# Patient Record
Sex: Female | Born: 1960 | Race: Black or African American | Hispanic: No | Marital: Married | Smoking: Current every day smoker
Health system: Southern US, Community
[De-identification: ages and names within clinical notes are randomized; demographics above are authoritative.]

## PROBLEM LIST (undated history)

## (undated) DIAGNOSIS — Z860100 Personal history of colon polyps, unspecified: Secondary | ICD-10-CM

## (undated) DIAGNOSIS — E785 Hyperlipidemia, unspecified: Secondary | ICD-10-CM

## (undated) DIAGNOSIS — E559 Vitamin D deficiency, unspecified: Secondary | ICD-10-CM

## (undated) DIAGNOSIS — R0789 Other chest pain: Secondary | ICD-10-CM

## (undated) DIAGNOSIS — R5382 Chronic fatigue, unspecified: Secondary | ICD-10-CM

## (undated) DIAGNOSIS — R42 Dizziness and giddiness: Secondary | ICD-10-CM

## (undated) DIAGNOSIS — I10 Essential (primary) hypertension: Secondary | ICD-10-CM

## (undated) DIAGNOSIS — N3281 Overactive bladder: Secondary | ICD-10-CM

## (undated) DIAGNOSIS — R739 Hyperglycemia, unspecified: Secondary | ICD-10-CM

## (undated) DIAGNOSIS — M1990 Unspecified osteoarthritis, unspecified site: Secondary | ICD-10-CM

## (undated) HISTORY — DX: Chronic fatigue, unspecified: R53.82

## (undated) HISTORY — DX: Essential (primary) hypertension: I10

## (undated) HISTORY — DX: Personal history of colon polyps, unspecified: Z86.0100

## (undated) HISTORY — PX: CHOLECYSTECTOMY: SHX55

## (undated) HISTORY — DX: Hyperglycemia, unspecified: R73.9

## (undated) HISTORY — DX: Hyperlipidemia, unspecified: E78.5

## (undated) HISTORY — DX: Other chest pain: R07.89

## (undated) HISTORY — DX: Personal history of colonic polyps: Z86.010

## (undated) HISTORY — DX: Vitamin D deficiency, unspecified: E55.9

## (undated) HISTORY — DX: Unspecified osteoarthritis, unspecified site: M19.90

## (undated) HISTORY — PX: COLONOSCOPY: SHX174

## (undated) HISTORY — PX: ABDOMINAL HYSTERECTOMY: SHX81

## (undated) HISTORY — DX: Overactive bladder: N32.81

---

## 2002-02-05 ENCOUNTER — Emergency Department (HOSPITAL_COMMUNITY): Admission: EM | Admit: 2002-02-05 | Discharge: 2002-02-06 | Payer: Self-pay | Admitting: Emergency Medicine

## 2002-02-06 ENCOUNTER — Encounter: Payer: Self-pay | Admitting: Emergency Medicine

## 2007-04-12 ENCOUNTER — Ambulatory Visit: Payer: Self-pay | Admitting: Cardiology

## 2009-06-20 LAB — HM HEPATITIS C SCREENING LAB: HM Hepatitis Screen: NEGATIVE

## 2009-07-02 ENCOUNTER — Emergency Department (HOSPITAL_COMMUNITY): Admission: EM | Admit: 2009-07-02 | Discharge: 2009-07-02 | Payer: Self-pay | Admitting: Emergency Medicine

## 2010-07-21 LAB — DIFFERENTIAL
Basophils Absolute: 0.1 10*3/uL (ref 0.0–0.1)
Basophils Relative: 1 % (ref 0–1)
Eosinophils Absolute: 0.1 10*3/uL (ref 0.0–0.7)
Eosinophils Relative: 1 % (ref 0–5)
Lymphocytes Relative: 29 % (ref 12–46)
Lymphs Abs: 2.2 10*3/uL (ref 0.7–4.0)
Monocytes Absolute: 0.4 10*3/uL (ref 0.1–1.0)
Monocytes Relative: 5 % (ref 3–12)
Neutro Abs: 4.8 10*3/uL (ref 1.7–7.7)
Neutrophils Relative %: 64 % (ref 43–77)

## 2010-07-21 LAB — BASIC METABOLIC PANEL
BUN: 13 mg/dL (ref 6–23)
CO2: 24 mEq/L (ref 19–32)
Calcium: 8.9 mg/dL (ref 8.4–10.5)
Chloride: 104 mEq/L (ref 96–112)
Creatinine, Ser: 0.8 mg/dL (ref 0.4–1.2)
GFR calc Af Amer: >60 mL/min (ref >60)
GFR calc non Af Amer: >60 mL/min (ref >60)
Glucose, Bld: 87 mg/dL (ref 70–99)
Potassium: 3.7 mEq/L (ref 3.5–5.1)
Sodium: 137 mEq/L (ref 135–145)

## 2010-07-21 LAB — POCT CARDIAC MARKERS
CKMB, poc: 2.4 ng/mL (ref 1.0–8.0)
Myoglobin, poc: 142 ng/mL (ref 12–200)
Myoglobin, poc: 153 ng/mL (ref 12–200)
Troponin i, poc: <0.05 ng/mL (ref 0.00–0.09)

## 2010-07-21 LAB — CBC
HCT: 41.5 % (ref 36.0–46.0)
Hemoglobin: 13.7 g/dL (ref 12.0–15.0)
MCHC: 33 g/dL (ref 30.0–36.0)
MCV: 86.6 fL (ref 78.0–100.0)
Platelets: 269 10*3/uL (ref 150–400)
RBC: 4.79 MIL/uL (ref 3.87–5.11)
RDW: 16.1 % — ABNORMAL HIGH (ref 11.5–15.5)
WBC: 7.6 10*3/uL (ref 4.0–10.5)

## 2010-07-23 ENCOUNTER — Emergency Department (INDEPENDENT_AMBULATORY_CARE_PROVIDER_SITE_OTHER): Payer: Worker's Compensation

## 2010-07-23 ENCOUNTER — Emergency Department (HOSPITAL_BASED_OUTPATIENT_CLINIC_OR_DEPARTMENT_OTHER)
Admission: EM | Admit: 2010-07-23 | Discharge: 2010-07-23 | Disposition: A | Discharge status: Home / Self Care | Payer: Worker's Compensation | Attending: Emergency Medicine | Admitting: Emergency Medicine

## 2010-07-23 DIAGNOSIS — W2203XA Walked into furniture, initial encounter: Secondary | ICD-10-CM

## 2010-07-23 DIAGNOSIS — S60229A Contusion of unspecified hand, initial encounter: Secondary | ICD-10-CM | POA: Insufficient documentation

## 2010-07-23 DIAGNOSIS — I10 Essential (primary) hypertension: Secondary | ICD-10-CM | POA: Insufficient documentation

## 2010-07-23 DIAGNOSIS — W2209XA Striking against other stationary object, initial encounter: Secondary | ICD-10-CM | POA: Insufficient documentation

## 2010-09-14 ENCOUNTER — Emergency Department (HOSPITAL_COMMUNITY)
Admission: EM | Admit: 2010-09-14 | Discharge: 2010-09-14 | Disposition: A | Discharge status: Home / Self Care | Payer: BC Managed Care – PPO | Attending: Emergency Medicine | Admitting: Emergency Medicine

## 2010-09-14 ENCOUNTER — Emergency Department (HOSPITAL_COMMUNITY): Payer: BC Managed Care – PPO

## 2010-09-14 DIAGNOSIS — S92109A Unspecified fracture of unspecified talus, initial encounter for closed fracture: Secondary | ICD-10-CM | POA: Insufficient documentation

## 2010-09-14 DIAGNOSIS — I10 Essential (primary) hypertension: Secondary | ICD-10-CM | POA: Insufficient documentation

## 2010-09-14 DIAGNOSIS — S92309A Fracture of unspecified metatarsal bone(s), unspecified foot, initial encounter for closed fracture: Secondary | ICD-10-CM | POA: Insufficient documentation

## 2010-09-14 DIAGNOSIS — M79609 Pain in unspecified limb: Secondary | ICD-10-CM | POA: Insufficient documentation

## 2010-09-14 DIAGNOSIS — M7989 Other specified soft tissue disorders: Secondary | ICD-10-CM | POA: Insufficient documentation

## 2010-09-14 DIAGNOSIS — X500XXA Overexertion from strenuous movement or load, initial encounter: Secondary | ICD-10-CM | POA: Insufficient documentation

## 2013-04-27 HISTORY — PX: COLONOSCOPY: SHX174

## 2015-01-29 ENCOUNTER — Encounter (HOSPITAL_BASED_OUTPATIENT_CLINIC_OR_DEPARTMENT_OTHER): Payer: Self-pay | Admitting: Emergency Medicine

## 2015-01-29 ENCOUNTER — Emergency Department (HOSPITAL_BASED_OUTPATIENT_CLINIC_OR_DEPARTMENT_OTHER): Payer: 59

## 2015-01-29 ENCOUNTER — Emergency Department (HOSPITAL_BASED_OUTPATIENT_CLINIC_OR_DEPARTMENT_OTHER)
Admission: EM | Admit: 2015-01-29 | Discharge: 2015-01-30 | Disposition: A | Discharge status: Home / Self Care | Payer: 59 | Attending: Emergency Medicine | Admitting: Emergency Medicine

## 2015-01-29 DIAGNOSIS — R110 Nausea: Secondary | ICD-10-CM | POA: Insufficient documentation

## 2015-01-29 DIAGNOSIS — R42 Dizziness and giddiness: Secondary | ICD-10-CM | POA: Insufficient documentation

## 2015-01-29 DIAGNOSIS — R079 Chest pain, unspecified: Secondary | ICD-10-CM

## 2015-01-29 DIAGNOSIS — Z720 Tobacco use: Secondary | ICD-10-CM | POA: Insufficient documentation

## 2015-01-29 DIAGNOSIS — Z79899 Other long term (current) drug therapy: Secondary | ICD-10-CM | POA: Insufficient documentation

## 2015-01-29 DIAGNOSIS — I10 Essential (primary) hypertension: Secondary | ICD-10-CM | POA: Insufficient documentation

## 2015-01-29 HISTORY — DX: Dizziness and giddiness: R42

## 2015-01-29 LAB — BASIC METABOLIC PANEL
ANION GAP: 7 (ref 5–15)
BUN: 16 mg/dL (ref 6–20)
CO2: 28 mmol/L (ref 22–32)
Calcium: 9.2 mg/dL (ref 8.9–10.3)
Chloride: 104 mmol/L (ref 101–111)
Creatinine, Ser: 0.98 mg/dL (ref 0.44–1.00)
Glucose, Bld: 101 mg/dL — ABNORMAL HIGH (ref 65–99)
POTASSIUM: 3.6 mmol/L (ref 3.5–5.1)
SODIUM: 139 mmol/L (ref 135–145)

## 2015-01-29 LAB — CBC
HEMATOCRIT: 40 % (ref 36.0–46.0)
Hemoglobin: 13.1 g/dL (ref 12.0–15.0)
MCH: 27 pg (ref 26.0–34.0)
MCHC: 32.8 g/dL (ref 30.0–36.0)
MCV: 82.5 fL (ref 78.0–100.0)
Platelets: 308 10*3/uL (ref 150–400)
RBC: 4.85 MIL/uL (ref 3.87–5.11)
RDW: 15 % (ref 11.5–15.5)
WBC: 9.3 10*3/uL (ref 4.0–10.5)

## 2015-01-29 LAB — TROPONIN I: Troponin I: <0.03 ng/mL (ref <0.031)

## 2015-01-29 MED ORDER — ONDANSETRON HCL 4 MG/2ML IJ SOLN
4.0000 mg | Freq: Once | INTRAMUSCULAR | Status: AC
  Administered 2015-01-29: 4 mg via INTRAVENOUS
  Filled 2015-01-29: qty 2

## 2015-01-29 MED ORDER — NITROGLYCERIN 0.4 MG SL SUBL
0.4000 mg | SUBLINGUAL_TABLET | SUBLINGUAL | Status: DC | PRN
Start: 2015-01-29 — End: 2015-01-30
  Administered 2015-01-29 (×3): 0.4 mg via SUBLINGUAL
  Filled 2015-01-29: qty 1

## 2015-01-29 MED ORDER — FENTANYL CITRATE (PF) 100 MCG/2ML IJ SOLN
50.0000 ug | Freq: Once | INTRAMUSCULAR | Status: AC
Start: 2015-01-29 — End: 2015-01-29
  Administered 2015-01-29: 50 ug via INTRAVENOUS
  Filled 2015-01-29: qty 2

## 2015-01-29 MED ORDER — ACETAMINOPHEN 325 MG PO TABS
650.0000 mg | ORAL_TABLET | Freq: Once | ORAL | Status: AC
  Administered 2015-01-29: 650 mg via ORAL
  Filled 2015-01-29: qty 2

## 2015-01-29 NOTE — ED Notes (Signed)
Pt expresses relief and decreased chest pain, now c/o headache.

## 2015-01-29 NOTE — Discharge Instructions (Signed)
Nonspecific Chest Pain  °Chest pain can be caused by many different conditions. There is always a chance that your pain could be related to something serious, such as a heart attack or a blood clot in your lungs. Chest pain can also be caused by conditions that are not life-threatening. If you have chest pain, it is very important to follow up with your health care provider. °CAUSES  °Chest pain can be caused by: °· Heartburn. °· Pneumonia or bronchitis. °· Anxiety or stress. °· Inflammation around your heart (pericarditis) or lung (pleuritis or pleurisy). °· A blood clot in your lung. °· A collapsed lung (pneumothorax). It can develop suddenly on its own (spontaneous pneumothorax) or from trauma to the chest. °· Shingles infection (varicella-zoster virus). °· Heart attack. °· Damage to the bones, muscles, and cartilage that make up your chest wall. This can include: °¨ Bruised bones due to injury. °¨ Strained muscles or cartilage due to frequent or repeated coughing or overwork. °¨ Fracture to one or more ribs. °¨ Sore cartilage due to inflammation (costochondritis). °RISK FACTORS  °Risk factors for chest pain may include: °· Activities that increase your risk for trauma or injury to your chest. °· Respiratory infections or conditions that cause frequent coughing. °· Medical conditions or overeating that can cause heartburn. °· Heart disease or family history of heart disease. °· Conditions or health behaviors that increase your risk of developing a blood clot. °· Having had chicken pox (varicella zoster). °SIGNS AND SYMPTOMS °Chest pain can feel like: °· Burning or tingling on the surface of your chest or deep in your chest. °· Crushing, pressure, aching, or squeezing pain. °· Dull or sharp pain that is worse when you move, cough, or take a deep breath. °· Pain that is also felt in your back, neck, shoulder, or arm, or pain that spreads to any of these areas. °Your chest pain may come and go, or it may stay  constant. °DIAGNOSIS °Lab tests or other studies may be needed to find the cause of your pain. Your health care provider may have you take a test called an ambulatory ECG (electrocardiogram). An ECG records your heartbeat patterns at the time the test is performed. You may also have other tests, such as: °· Transthoracic echocardiogram (TTE). During echocardiography, sound waves are used to create a picture of all of the heart structures and to look at how blood flows through your heart. °· Transesophageal echocardiogram (TEE). This is a more advanced imaging test that obtains images from inside your body. It allows your health care provider to see your heart in finer detail. °· Cardiac monitoring. This allows your health care provider to monitor your heart rate and rhythm in real time. °· Holter monitor. This is a portable device that records your heartbeat and can help to diagnose abnormal heartbeats. It allows your health care provider to track your heart activity for several days, if needed. °· Stress tests. These can be done through exercise or by taking medicine that makes your heart beat more quickly. °· Blood tests. °· Imaging tests. °TREATMENT  °Your treatment depends on what is causing your chest pain. Treatment may include: °· Medicines. These may include: °¨ Acid blockers for heartburn. °¨ Anti-inflammatory medicine. °¨ Pain medicine for inflammatory conditions. °¨ Antibiotic medicine, if an infection is present. °¨ Medicines to dissolve blood clots. °¨ Medicines to treat coronary artery disease. °· Supportive care for conditions that do not require medicines. This may include: °¨ Resting. °¨ Applying heat   or cold packs to injured areas. °¨ Limiting activities until pain decreases. °HOME CARE INSTRUCTIONS °· If you were prescribed an antibiotic medicine, finish it all even if you start to feel better. °· Avoid any activities that bring on chest pain. °· Do not use any tobacco products, including  cigarettes, chewing tobacco, or electronic cigarettes. If you need help quitting, ask your health care provider. °· Do not drink alcohol. °· Take medicines only as directed by your health care provider. °· Keep all follow-up visits as directed by your health care provider. This is important. This includes any further testing if your chest pain does not go away. °· If heartburn is the cause for your chest pain, you may be told to keep your head raised (elevated) while sleeping. This reduces the chance that acid will go from your stomach into your esophagus. °· Make lifestyle changes as directed by your health care provider. These may include: °¨ Getting regular exercise. Ask your health care provider to suggest some activities that are safe for you. °¨ Eating a heart-healthy diet. A registered dietitian can help you to learn healthy eating options. °¨ Maintaining a healthy weight. °¨ Managing diabetes, if necessary. °¨ Reducing stress. °SEEK MEDICAL CARE IF: °· Your chest pain does not go away after treatment. °· You have a rash with blisters on your chest. °· You have a fever. °SEEK IMMEDIATE MEDICAL CARE IF:  °· Your chest pain is worse. °· You have an increasing cough, or you cough up blood. °· You have severe abdominal pain. °· You have severe weakness. °· You faint. °· You have chills. °· You have sudden, unexplained chest discomfort. °· You have sudden, unexplained discomfort in your arms, back, neck, or jaw. °· You have shortness of breath at any time. °· You suddenly start to sweat, or your skin gets clammy. °· You feel nauseous or you vomit. °· You suddenly feel light-headed or dizzy. °· Your heart begins to beat quickly, or it feels like it is skipping beats. °These symptoms may represent a serious problem that is an emergency. Do not wait to see if the symptoms will go away. Get medical help right away. Call your local emergency services (911 in the [place] Do not drive yourself to the hospital. °  °This  information is not intended to replace advice given to you by your health care provider. Make sure you discuss any questions you have with your health care provider. °  °Document Released: 01/21/2005 Document Revised: 05/04/2014 Document Reviewed: 11/17/2013 °Elsevier Interactive Patient Education ©2016 [employer] ° °

## 2015-01-29 NOTE — ED Provider Notes (Signed)
CSN: [id number]     Arrival date & time 01/29/15  2131 History  By signing my name below, I, Rhonda Merritt, attest that this documentation has been prepared under the direction and in the presence of Quintella Reichert, MD. Electronically Signed: Julien Nordmann, ED Scribe. 01/29/2015. 10:39 PM.    Chief Complaint  Patient presents with  . Chest Pain      The history is provided by the patient. No language interpreter was used.   HPI Comments: Rhonda Merritt is a 54 y.o. female who has a hx of HTNpresents to the Emergency Department complaining of waxing and waning, gradual worsening left sided chest pain onset 4 hours ago. She has associated left arm pain, nausea and dizziness. Pt rates her current pain a 6/10. Pt reports she was driving to work when she felt a sudden tightness in her chest. Pt notes having this pain about 2 years ago and states she was evaluated for it and had a stress test done. Pt denies abdominal pain, diaphoresis, swelling in legs, hormone medications, heart catheter, street drugs, surgeries in the last month. Pt is a smoker.  Past Medical History  Diagnosis Date  . Hypertension   . Vertigo    Past Surgical History  Procedure Laterality Date  . Colonoscopy    . Cholecystectomy    . Abdominal hysterectomy     History reviewed. No pertinent family history. Social History  Substance Use Topics  . Smoking status: Current Every Day Smoker  . Smokeless tobacco: None  . Alcohol Use: Yes     Comment: occ   OB History    No data available     Review of Systems  Constitutional: Negative for diaphoresis.  Cardiovascular: Positive for chest pain.  Gastrointestinal: Positive for nausea. Negative for abdominal pain.  Neurological: Positive for dizziness.  All other systems reviewed and are negative.     Allergies  Asa; Contrast media; and Imitrex  Home Medications   Prior to Admission medications   Medication Sig Start Date End Date Taking? Authorizing  Provider  losartan (COZAAR) 100 MG tablet Take 100 mg by mouth daily.   Yes Historical Provider, MD   Triage vitals: BP 139/93 mmHg  Pulse 83  Temp(Src) 98.3 F (36.8 C) (Oral)  Resp 16  Ht 5\' 5"  (1.651 m)  Wt 210 lb (95.255 kg)  BMI 34.95 kg/m2  SpO2 100% Physical Exam  Constitutional: She is oriented to person, place, and time. She appears well-developed and well-nourished.  HENT:  Head: Normocephalic and atraumatic.  Cardiovascular: Normal rate and regular rhythm.   No murmur heard. Pulmonary/Chest: Effort normal and breath sounds normal. No respiratory distress. She exhibits no tenderness.  No chest wall tenderness  Abdominal: Soft. There is no tenderness. There is no rebound and no guarding.  Musculoskeletal: She exhibits no edema or tenderness.  Neurological: She is alert and oriented to person, place, and time.  Skin: Skin is warm and dry.  Psychiatric: She has a normal mood and affect. Her behavior is normal.  Nursing note and vitals reviewed.   ED Course  Procedures  DIAGNOSTIC STUDIES: Oxygen Saturation is 100% on RA, normal by my interpretation.  COORDINATION OF CARE:  10:37 PM Discussed treatment plan with pt at bedside and pt agreed to plan.  Labs Review Labs Reviewed  BASIC METABOLIC PANEL - Abnormal; Notable for the following:    Glucose, Bld 101 (*)    All other components within normal limits  CBC  TROPONIN I  Imaging Review Dg Chest 2 View  01/29/2015   CLINICAL DATA:  Acute onset of left upper chest pain and shortness of breath. Initial encounter.  EXAM: CHEST  2 VIEW  COMPARISON:  Chest radiograph from 07/02/2009  FINDINGS: The lungs are well-aerated and clear. There is no evidence of focal opacification, pleural effusion or pneumothorax.  The heart is normal in size; the mediastinal contour is within normal limits. Postoperative change is noted at the superior mediastinum. No acute osseous abnormalities are seen. Clips are noted within the right  upper quadrant, reflecting prior cholecystectomy.  IMPRESSION: No acute cardiopulmonary process seen.   Electronically Signed   By: Garald Balding M.D.   On: 01/29/2015 22:44   I have personally reviewed and evaluated these images and lab results as part of my medical decision-making.   EKG Interpretation   Date/Time:  Tuesday January 29 2015 21:39:32 EDT Ventricular Rate:  83 PR Interval:  158 QRS Duration: 82 QT Interval:  374 QTC Calculation: 439 R Axis:   47 Text Interpretation:  Normal sinus rhythm Cannot rule out Anterior infarct  , age undetermined Abnormal ECG Confirmed by Hazle Coca [phone]) on  01/29/2015 9:43:49 PM      MDM   Final diagnoses:  Chest pain, unspecified chest pain type   Patient here for evaluation of left-sided chest pain. Pain has been constant since it started but does have moments when it is worse. EKG with no ischemic changes. Patient was given nitroglycerin in the emergency department with no significant change in her symptoms. Presentation is not consistent with dissection, PE, referred GI pain, pneumonia, ACS. Plan to treat pain with fentanyl, repeat troponin, repeat evaluation. Patient care transferred pending repeat troponin and pain control. Patient with similar previous episode of pain with a stress test that was negative at that time. Unable to obtain records from prior episode.  I personally performed the services described in this documentation, which was scribed in my presence. The recorded information has been reviewed and is accurate.   Quintella Reichert, MD 01/29/15 2350

## 2015-01-29 NOTE — ED Notes (Signed)
Patient states that she was driving to work and she started to have pain to her left chest, arm and neck. The patient reports that she has had pain continuously since about 6 30

## 2015-01-30 LAB — TROPONIN I: Troponin I: <0.03 ng/mL (ref <0.031)

## 2015-01-30 NOTE — ED Notes (Signed)
Pt verbalizes understanding of d/c instructions and denies any further needs at this time. 

## 2017-12-14 ENCOUNTER — Encounter: Payer: Self-pay | Admitting: Cardiology

## 2017-12-14 ENCOUNTER — Encounter: Payer: Self-pay | Admitting: *Deleted

## 2017-12-14 NOTE — Progress Notes (Signed)
Cardiology Office Note  Date: 12/15/2017   ID: Rhonda Merritt, DOB 01/20/1961, MRN [id number]  PCP: Octavio Graves, DO  Consulting Cardiologist: Rozann Lesches, MD   Chief Complaint  Patient presents with  . Cardiac assessment    History of Present Illness: Rhonda Merritt is a 57 y.o. female referred for cardiology consultation by Dr. Melina Copa for cardiac evaluation.  I reviewed the available records and updated the chart.  She reports a long-standing history of intermittent chest pain, describes a dull ache that is typically sporadic, not exertional.  She states that she has been hospitalized a few times and diagnosed with "chest wall pain."  She does have a family history of premature CAD in her father.  Also personal cardiac risk factors including hypertension and elevated lipids which she manages by diet.  She smokes cigarettes, has been trying to quit and has done so intermittently.  Did not tolerate Chantix.  I reviewed her current medications.  She is on Hyzaar for blood pressure control.  I personally reviewed her ECG today which shows sinus rhythm with decreased R wave progression.  She has not undergone any ischemic testing in at least 5 years.  Past Medical History:  Diagnosis Date  . Chest wall pain   . Chronic fatigue   . Elevated blood sugar   . Essential hypertension   . History of colonic polyps   . Hyperlipidemia   . Osteoarthritis   . Vertigo   . Vitamin D deficiency     Past Surgical History:  Procedure Laterality Date  . ABDOMINAL HYSTERECTOMY    . CHOLECYSTECTOMY    . COLONOSCOPY      Current Outpatient Medications  Medication Sig Dispense Refill  . Black Cohosh 40 MG CAPS Take 2 capsules by mouth. 4 x week    . ergocalciferol (VITAMIN D2) 50000 units capsule Take 50,000 Units by mouth once a week.    . losartan-hydrochlorothiazide (HYZAAR) 100-25 MG tablet Take 1 tablet by mouth daily.    . meclizine (ANTIVERT) 25 MG tablet Take 25 mg by mouth  3 (three) times daily as needed for dizziness.    . phentermine 37.5 MG capsule Take 37.5 mg by mouth every morning.    Marland Kitchen Pumpkin Seed-Soy Germ (AZO BLADDER CONTROL/GO-LESS PO) Take by mouth as needed.     No current facility-administered medications for this visit.    Allergies:  Asa [aspirin]; Contrast media [iodinated diagnostic agents]; and Imitrex [sumatriptan]   Social History: The patient  reports that she has been smoking cigarettes. She has never used smokeless tobacco. She reports that she drinks alcohol. She reports that she does not use drugs.   Family History: The patient's family history includes Breast cancer in her sister; Diabetes Mellitus II in her father and maternal grandmother; Heart disease in her father; Hypertension in her father; Ovarian cancer in her sister.   ROS:  Please see the history of present illness. Otherwise, complete review of systems is positive for bilateral plantar fasciitis and bone spurs in her feet.  All other systems are reviewed and negative.   Physical Exam: VS:  BP 129/85   Pulse 76   Ht 5\' 4"  (1.626 m)   Wt 221 lb 9.6 oz (100.5 kg)   SpO2 97%   BMI 38.04 kg/m , BMI Body mass index is 38.04 kg/m.  Wt Readings from Last 3 Encounters:  12/15/17 221 lb 9.6 oz (100.5 kg)  11/18/17 218 lb (98.9 kg)  01/29/15 210 lb (95.3  kg)    General: Patient appears comfortable at rest. HEENT: Conjunctiva and lids normal, oropharynx clear. Neck: Supple, no elevated JVP or carotid bruits, no thyromegaly. Lungs: Clear to auscultation, nonlabored breathing at rest. Cardiac: Regular rate and rhythm, no S3 or significant systolic murmur, no pericardial rub. Abdomen: Soft, nontender, bowel sounds present, no guarding or rebound. Extremities: No pitting edema, distal pulses 2+. Skin: Warm and dry. Musculoskeletal: No kyphosis. Neuropsychiatric: Alert and oriented x3, affect grossly appropriate.  ECG: I personally reviewed the tracing from 01/29/2015 which  showed sinus rhythm with decreased R wave progression.  Other Studies Reviewed Today:  Chest x-ray 01/29/2015: FINDINGS: The lungs are well-aerated and clear. There is no evidence of focal opacification, pleural effusion or pneumothorax.  The heart is normal in size; the mediastinal contour is within normal limits. Postoperative change is noted at the superior mediastinum. No acute osseous abnormalities are seen. Clips are noted within the right upper quadrant, reflecting prior cholecystectomy.  IMPRESSION: No acute cardiopulmonary process seen.  Assessment and Plan:  1.  Recurring atypical chest pain in a 57 year old woman with personal history of hypertension as well as diet managed hyperlipidemia, family history of premature CAD in her father, and tobacco abuse.  ECG shows sinus rhythm with decreased R wave progression.  She has not undergone any ischemic testing in at least 5 years.  Plan will be to proceed with a Lexiscan Myoview for further evaluation.  She states that she will have difficulty ambulating on a treadmill due to plantar fasciitis bilaterally and bone spurs in her feet.  2.  Essential hypertension, currently on Hyzaar.  Blood pressure control is reasonable today.  Keep follow-up with Dr. Melina Copa.  3.  Tobacco abuse.  We discussed smoking cessation techniques.  She seems motivated to quit ultimately.  4.  Hyperlipidemia, managed by diet.  This is followed by Dr. Melina Copa.  Current medicines were reviewed with the patient today.   Orders Placed This Encounter  Procedures  . NM Myocar Multi W/Spect W/Wall Motion / EF  . EKG 12-Lead    Disposition: Call with test results.  Signed, Satira Sark, MD, [facility] 12/15/2017 9:49 AM    [facility], [place], [place] [address] Phone: [phone]; Fax: [phone]

## 2017-12-15 ENCOUNTER — Telehealth: Payer: Self-pay | Admitting: Cardiology

## 2017-12-15 ENCOUNTER — Ambulatory Visit (INDEPENDENT_AMBULATORY_CARE_PROVIDER_SITE_OTHER): Payer: 59 | Admitting: Cardiology

## 2017-12-15 ENCOUNTER — Encounter: Payer: Self-pay | Admitting: Cardiology

## 2017-12-15 VITALS — BP 129/85 | HR 76 | Ht 64.0 in | Wt 221.6 lb

## 2017-12-15 DIAGNOSIS — Z720 Tobacco use: Secondary | ICD-10-CM

## 2017-12-15 DIAGNOSIS — I10 Essential (primary) hypertension: Secondary | ICD-10-CM

## 2017-12-15 DIAGNOSIS — R0789 Other chest pain: Secondary | ICD-10-CM | POA: Diagnosis not present

## 2017-12-15 DIAGNOSIS — Z8249 Family history of ischemic heart disease and other diseases of the circulatory system: Secondary | ICD-10-CM

## 2017-12-15 DIAGNOSIS — E782 Mixed hyperlipidemia: Secondary | ICD-10-CM

## 2017-12-15 NOTE — Patient Instructions (Signed)
Medication Instructions:  Your physician recommends that you continue on your current medications as directed. Please refer to the Current Medication list given to you today.  Labwork: NONE  Testing/Procedures: Your physician has requested that you have a lexiscan myoview. For further information please visit [email or web address]. Please follow instruction sheet, as given.  Follow-Up: Your physician recommends that you schedule a follow-up appointment PENDING TEST RESULTS  Any Other Special Instructions Will Be Listed Below (If Applicable).  If you need a refill on your cardiac medications before your next appointment, please call your pharmacy. 

## 2017-12-15 NOTE — Telephone Encounter (Signed)
°  Precert needed for: Lexi- atypical chest pain    Location: [facility]    Date: Dec 21, 2017

## 2017-12-21 ENCOUNTER — Encounter (HOSPITAL_COMMUNITY)
Admission: RE | Admit: 2017-12-21 | Discharge: 2017-12-21 | Disposition: A | Discharge status: Home / Self Care | Payer: 59 | Source: Ambulatory Visit | Attending: Cardiology | Admitting: Cardiology

## 2017-12-21 ENCOUNTER — Encounter (HOSPITAL_COMMUNITY): Payer: Self-pay

## 2017-12-21 ENCOUNTER — Encounter (HOSPITAL_BASED_OUTPATIENT_CLINIC_OR_DEPARTMENT_OTHER)
Admission: RE | Admit: 2017-12-21 | Discharge: 2017-12-21 | Disposition: A | Discharge status: Home / Self Care | Payer: 59 | Source: Ambulatory Visit | Attending: Cardiology | Admitting: Cardiology

## 2017-12-21 DIAGNOSIS — R0789 Other chest pain: Secondary | ICD-10-CM

## 2017-12-21 LAB — NM MYOCAR MULTI W/SPECT W/WALL MOTION / EF
CHL CUP RESTING HR STRESS: 74 {beats}/min
LV dias vol: 73 mL (ref 46–106)
LVSYSVOL: 32 mL
NUC STRESS TID: 0.89
Peak HR: 100 {beats}/min
RATE: 0.23
SDS: 0
SRS: 0
SSS: 0

## 2017-12-21 MED ORDER — REGADENOSON 0.4 MG/5ML IV SOLN
INTRAVENOUS | Status: AC
  Administered 2017-12-21: 0.4 mg via INTRAVENOUS
  Filled 2017-12-21: qty 5

## 2017-12-21 MED ORDER — TECHNETIUM TC 99M TETROFOSMIN IV KIT
10.0000 | PACK | Freq: Once | INTRAVENOUS | Status: AC | PRN
  Administered 2017-12-21: 11 via INTRAVENOUS

## 2017-12-21 MED ORDER — SODIUM CHLORIDE 0.9% FLUSH
INTRAVENOUS | Status: AC
  Filled 2017-12-21: qty 160

## 2017-12-21 MED ORDER — TECHNETIUM TC 99M TETROFOSMIN IV KIT
30.0000 | PACK | Freq: Once | INTRAVENOUS | Status: AC | PRN
  Administered 2017-12-21: 31 via INTRAVENOUS

## 2017-12-21 MED ORDER — SODIUM CHLORIDE 0.9% FLUSH
INTRAVENOUS | Status: AC
  Administered 2017-12-21: 10 mL via INTRAVENOUS
  Filled 2017-12-21: qty 10

## 2017-12-22 ENCOUNTER — Telehealth: Payer: Self-pay | Admitting: *Deleted

## 2017-12-22 NOTE — Telephone Encounter (Signed)
-----   Message from Satira Sark, MD sent at 12/21/2017  3:34 PM EDT ----- Results reviewed.  Please let her know that the stress test was reassuring, low risk overall.  Would focus on risk factor modification and keep follow-up with Dr. Melina Copa. A copy of this test should be forwarded to Octavio Graves, DO.

## 2017-12-22 NOTE — Telephone Encounter (Signed)
Patient informed. Copy sent to PCP °

## 2017-12-22 NOTE — Telephone Encounter (Signed)
Follow up    Patient is returning call in reference to stress test results. Please call.

## 2019-02-27 ENCOUNTER — Telehealth: Payer: Self-pay | Admitting: Family Medicine

## 2019-02-28 ENCOUNTER — Encounter: Payer: Self-pay | Admitting: Family Medicine

## 2019-02-28 ENCOUNTER — Ambulatory Visit (INDEPENDENT_AMBULATORY_CARE_PROVIDER_SITE_OTHER): Payer: 59 | Admitting: Family Medicine

## 2019-02-28 VITALS — Ht 65.0 in | Wt 201.0 lb

## 2019-02-28 DIAGNOSIS — R739 Hyperglycemia, unspecified: Secondary | ICD-10-CM | POA: Insufficient documentation

## 2019-02-28 DIAGNOSIS — N644 Mastodynia: Secondary | ICD-10-CM | POA: Diagnosis not present

## 2019-02-28 DIAGNOSIS — I10 Essential (primary) hypertension: Secondary | ICD-10-CM | POA: Diagnosis not present

## 2019-02-28 DIAGNOSIS — E669 Obesity, unspecified: Secondary | ICD-10-CM | POA: Diagnosis not present

## 2019-02-28 DIAGNOSIS — Z1211 Encounter for screening for malignant neoplasm of colon: Secondary | ICD-10-CM

## 2019-02-28 DIAGNOSIS — Z86010 Personal history of colonic polyps: Secondary | ICD-10-CM

## 2019-02-28 DIAGNOSIS — N3281 Overactive bladder: Secondary | ICD-10-CM

## 2019-02-28 DIAGNOSIS — E559 Vitamin D deficiency, unspecified: Secondary | ICD-10-CM | POA: Insufficient documentation

## 2019-02-28 DIAGNOSIS — R0789 Other chest pain: Secondary | ICD-10-CM | POA: Insufficient documentation

## 2019-02-28 DIAGNOSIS — R42 Dizziness and giddiness: Secondary | ICD-10-CM | POA: Insufficient documentation

## 2019-02-28 DIAGNOSIS — Z720 Tobacco use: Secondary | ICD-10-CM | POA: Insufficient documentation

## 2019-02-28 DIAGNOSIS — R5382 Chronic fatigue, unspecified: Secondary | ICD-10-CM | POA: Insufficient documentation

## 2019-02-28 DIAGNOSIS — E785 Hyperlipidemia, unspecified: Secondary | ICD-10-CM | POA: Insufficient documentation

## 2019-02-28 MED ORDER — ERGOCALCIFEROL 1.25 MG (50000 UT) PO CAPS: 50000.0000 [IU] | ORAL_CAPSULE | ORAL | 1 refills | Status: AC

## 2019-02-28 NOTE — Progress Notes (Signed)
Virtual Visit via Telephone Note  I connected with Rhonda Merritt on 02/28/19 at 1:05 PM by telephone and verified that I am speaking with the correct person using two identifiers. Rhonda Merritt is currently located in her car and nobody is currently with her during this visit. The provider, Loman Brooklyn, FNP is located in their office at time of visit.  I discussed the limitations, risks, security and privacy concerns of performing an evaluation and management service by telephone and the availability of in person appointments. I also discussed with the patient that there may be a patient responsible charge related to this service. The patient expressed understanding and agreed to proceed.  Subjective: PCP: Loman Brooklyn, FNP  Chief Complaint  Patient presents with  . Establish Care   Patient is having a telephone visit today due to abdominal pain during the COVID-19 pandemic.   Patient was taking phentermine 37.5 PO QD in June and July and reports she lost 32 lbs taking it while doing the keto diet. She is interested in getting a new prescription. She is walking daily for exercise.   Patient's most recent mammogram was on 06/30/2018 at which time there was a possibly abnormality. A focal right breast ultrasound was recommended. Patient report she has been having bilateral breast pain and feels something is wrong. It is worse on the right. She has tried taking medication for indigestion when it is bothering her more to rule out indigestion but reports this offered no relief.   She has a knot on her foot she would like looked at when she comes in for an appointment.    ROS: Per HPI  Current Outpatient Medications:  .  Black Cohosh 40 MG CAPS, Take 1-2 capsules by mouth daily., Disp: , Rfl:  .  ergocalciferol (VITAMIN D2) 50000 units capsule, Take 50,000 Units by mouth once a week., Disp: , Rfl:  .  losartan-hydrochlorothiazide (HYZAAR) 100-25 MG tablet, Take 1 tablet by mouth  daily., Disp: , Rfl:  .  meclizine (ANTIVERT) 25 MG tablet, Take 25 mg by mouth 3 (three) times daily as needed for dizziness., Disp: , Rfl:  .  Pumpkin Seed-Soy Germ (AZO BLADDER CONTROL/GO-LESS PO), Take by mouth as needed., Disp: , Rfl:   Allergies  Allergen Reactions  . Asa [Aspirin] Hives  . Cefadroxil Hives  . Contrast Media [Iodinated Diagnostic Agents] Hives  . Imitrex [Sumatriptan] Hives   Past Medical History:  Diagnosis Date  . Chest wall pain   . Chronic fatigue   . Elevated blood sugar   . Essential hypertension   . History of colonic polyps   . Hyperlipidemia   . OAB (overactive bladder)   . Osteoarthritis   . Vertigo   . Vitamin D deficiency     Observations/Objective: Today's Vitals   02/28/19 1332  Weight: 201 lb (91.2 kg)  Height: 5\' 5"  (1.651 m)   Body mass index is 33.45 kg/m.  A&O  No respiratory distress or wheezing audible over the phone Mood, judgement, and thought processes all WNL  Assessment and Plan: 1. Obesity (BMI 30.0-34.9) - Encouraged patient to continue diet and exercise. Will prescribe phentermine at in-person office visit if BP is well controlled.   2. Breast pain - Patient to come for in-person office visit when abdominal pain resolved for assessment.   3. Essential hypertension - Continue current regimen.   4. OAB (overactive bladder) - Well controlled on current regimen.   5. Vertigo - Uses Meclizine as  needed.   6. Vitamin D deficiency - Refilled vitamin D 50,000 units PO once weekly.   7-8. History of colonic polyps/Colon cancer screening - Referred to GI for colonoscopy.   9. Tobacco use - Encouraged smoking cessation.    Follow Up Instructions:  Return when symptoms resolve, for weight loss, breast pain, and knot on foot.  I discussed the assessment and treatment plan with the patient. The patient was provided an opportunity to ask questions and all were answered. The patient agreed with the plan and  demonstrated an understanding of the instructions.   The patient was advised to call back or seek an in-person evaluation if the symptoms worsen or if the condition fails to improve as anticipated.  The above assessment and management plan was discussed with the patient. The patient verbalized understanding of and has agreed to the management plan. Patient is aware to call the clinic if symptoms persist or worsen. Patient is aware when to return to the clinic for a follow-up visit. Patient educated on when it is appropriate to go to the emergency department.   Time call ended: 1:40 PM  I provided 35 minutes of non-face-to-face time during this encounter.  Hendricks Limes, MSN, APRN, FNP-C [facility] 02/28/19

## 2019-03-21 NOTE — Progress Notes (Signed)
Assessment & Plan:  1. Morbid obesity ([place]) - Patient to continue diet and exercise. - phentermine 37.5 MG capsule; Take 1 capsule (37.5 mg total) by mouth every morning.  Dispense: 30 capsule; Refill: 2  2. Breast pain - MM Digital Diagnostic Bilat; Future  3. Abnormal mammogram of right breast - MM Digital Diagnostic Bilat; Future  4. Chest tightness - Resolves with use of albuterol inhaler. - Pre and Post Spirometry  5. Cough - Pre and Post Spirometry  6. Tobacco use - CBC with Differential/Platelet; Future - Pre and Post Spirometry  7. Hair loss - TSH; Future  8. Chronic fatigue - CBC with Differential/Platelet; Future - TSH; Future  9. Immunization due - SHINGRIX injection; Inject 0.5 mLs into the muscle once for 1 dose.  Dispense: 0.5 mL; Refill: 0  10. Need for immunization against influenza - Flu Vaccine QUAD 36+ mos IM  11. Essential hypertension - Well controlled on current regimen.  - CMP14+EGFR; Future - Lipid Panel; Future  12. Mixed hyperlipidemia - CMP14+EGFR; Future - Lipid Panel; Future  13. Vitamin D deficiency - Vitamin D 25 hydroxy; Future   Return in about 3 months (around 06/26/2019) for weight loss .  Hendricks Limes, MSN, APRN, FNP-C [facility]  Subjective:    Patient ID: Rhonda Merritt, female    DOB: 1960-07-28, 58 y.o.   MRN: [id number]  Patient Care Team: Loman Brooklyn, FNP as PCP - General (Family Medicine)   Chief Complaint:  Chief Complaint  Patient presents with  . 1 month follow    weight loss, knot on foot and bilater breast pain    HPI: Rhonda Merritt is a 58 y.o. female presenting on 03/28/2019 for 1 month follow (weight loss, knot on foot and bilater breast pain)  Patient is here today to have a knot on her foot looked at, breast pain, and weight loss.  Patient here to resume phentermine 37.5 mg once daily.  Reports her weight is starting to go back up.  She was down to 200 pounds  from 232 pounds and is up to 204 pounds today.  She is exercising daily by walking.  She does well watching what she eats.  She is very motivated to lose weight.  Patient reports a chest tightness that improves with use of her albuterol inhaler.  She does smoke.  She does have a chronic cough.  She does have a history of bronchitis has not had it in the past 2 years.  She has never had spirometry completed, but is interested in doing so.  Patient reports bilateral breast pain that is worse on the right than the left.  She did have a mammogram in March which recommended a right breast ultrasound.  She was not having this pain then.  The knot on her foot has improved greatly since our telephone visit a month ago.  New complaints: None  Social history:  Relevant past medical, surgical, family and social history reviewed and updated as indicated. Interim medical history since our last visit reviewed.  Allergies and medications reviewed and updated.  DATA REVIEWED: CHART IN EPIC  ROS: Negative unless specifically indicated above in HPI.    Current Outpatient Medications:  .  Black Cohosh 40 MG CAPS, Take 1-2 capsules by mouth daily., Disp: , Rfl:  .  ergocalciferol (VITAMIN D2) 1.25 MG (50000 UT) capsule, Take 1 capsule (50,000 Units total) by mouth once a week., Disp: 12 capsule, Rfl: 1 .  losartan-hydrochlorothiazide (HYZAAR)  100-25 MG tablet, Take 1 tablet by mouth daily., Disp: , Rfl:  .  meclizine (ANTIVERT) 25 MG tablet, Take 25 mg by mouth 3 (three) times daily as needed for dizziness., Disp: , Rfl:  .  Pumpkin Seed-Soy Germ (AZO BLADDER CONTROL/GO-LESS PO), Take by mouth as needed., Disp: , Rfl:  .  phentermine 37.5 MG capsule, Take 1 capsule (37.5 mg total) by mouth every morning., Disp: 30 capsule, Rfl: 2 .  SHINGRIX injection, Inject 0.5 mLs into the muscle once for 1 dose., Disp: 0.5 mL, Rfl: 0   Allergies  Allergen Reactions  . Asa [Aspirin] Hives  . Cefadroxil Hives  .  Contrast Media [Iodinated Diagnostic Agents] Hives  . Imitrex [Sumatriptan] Hives   Past Medical History:  Diagnosis Date  . Chest wall pain   . Chronic fatigue   . Elevated blood sugar   . Essential hypertension   . History of colonic polyps   . Hyperlipidemia   . OAB (overactive bladder)   . Osteoarthritis   . Vertigo   . Vitamin D deficiency     Past Surgical History:  Procedure Laterality Date  . ABDOMINAL HYSTERECTOMY    . CHOLECYSTECTOMY    . COLONOSCOPY  2015    Social History   Socioeconomic History  . Marital status: Married    Spouse name: Not on file  . Number of children: Not on file  . Years of education: Not on file  . Highest education level: Not on file  Occupational History  . Not on file  Social Needs  . Financial resource strain: Not on file  . Food insecurity    Worry: Not on file    Inability: Not on file  . Transportation needs    Medical: Not on file    Non-medical: Not on file  Tobacco Use  . Smoking status: Current Every Day Smoker    Types: Cigarettes  . Smokeless tobacco: Never Used  . Tobacco comment: 7 ciggs per day   Substance and Sexual Activity  . Alcohol use: Yes    Comment: Occasional on special occasions  . Drug use: No  . Sexual activity: Not on file  Lifestyle  . Physical activity    Days per week: Not on file    Minutes per session: Not on file  . Stress: Not on file  Relationships  . Social Herbalist on phone: Not on file    Gets together: Not on file    Attends religious service: Not on file    Active member of club or organization: Not on file    Attends meetings of clubs or organizations: Not on file    Relationship status: Not on file  . Intimate partner violence    Fear of current or ex partner: Not on file    Emotionally abused: Not on file    Physically abused: Not on file    Forced sexual activity: Not on file  Other Topics Concern  . Not on file  Social History Narrative  . Not on file         Objective:    BP 108/76   Pulse 86   Temp 98.4 F (36.9 C) (Temporal)   Ht 5' 5"  (1.651 m)   Wt 207 lb 3.2 oz (94 kg)   SpO2 100%   BMI 34.48 kg/m   Physical Exam Vitals signs reviewed. Exam conducted with a chaperone present.  Constitutional:      General: She  is not in acute distress.    Appearance: Normal appearance. She is obese. She is not ill-appearing, toxic-appearing or diaphoretic.  HENT:     Head: Normocephalic and atraumatic.  Eyes:     General: No scleral icterus.       Right eye: No discharge.        Left eye: No discharge.     Conjunctiva/sclera: Conjunctivae normal.  Neck:     Musculoskeletal: Normal range of motion.  Cardiovascular:     Rate and Rhythm: Normal rate and regular rhythm.     Heart sounds: Normal heart sounds. No murmur. No friction rub. No gallop.   Pulmonary:     Effort: Pulmonary effort is normal. No respiratory distress.     Breath sounds: Normal breath sounds. No stridor. No wheezing, rhonchi or rales.  Chest:     Breasts:        Right: Tenderness (5-8 o'clock) present. No swelling, bleeding, inverted nipple, mass, nipple discharge or skin change.        Left: Tenderness (4-7 o-clock) present. No swelling, bleeding, inverted nipple, mass, nipple discharge or skin change.  Musculoskeletal: Normal range of motion.  Lymphadenopathy:     Upper Body:     Right upper body: No supraclavicular, axillary or pectoral adenopathy.     Left upper body: No supraclavicular, axillary or pectoral adenopathy.  Skin:    General: Skin is warm and dry.     Capillary Refill: Capillary refill takes less than 2 seconds.  Neurological:     General: No focal deficit present.     Mental Status: She is alert and oriented to person, place, and time. Mental status is at baseline.  Psychiatric:        Mood and Affect: Mood normal.        Behavior: Behavior normal.        Thought Content: Thought content normal.        Judgment: Judgment normal.     No  results found for: TSH Lab Results  Component Value Date   WBC 9.3 01/29/2015   HGB 13.1 01/29/2015   HCT 40.0 01/29/2015   MCV 82.5 01/29/2015   PLT 308 01/29/2015   Lab Results  Component Value Date   NA 139 01/29/2015   K 3.6 01/29/2015   CO2 28 01/29/2015   GLUCOSE 101 (H) 01/29/2015   BUN 16 01/29/2015   CREATININE 0.98 01/29/2015   CALCIUM 9.2 01/29/2015   ANIONGAP 7 01/29/2015   No results found for: CHOL No results found for: HDL No results found for: LDLCALC No results found for: TRIG No results found for: CHOLHDL No results found for: HGBA1C

## 2019-03-27 ENCOUNTER — Other Ambulatory Visit: Payer: Self-pay

## 2019-03-28 ENCOUNTER — Encounter: Payer: Self-pay | Admitting: Family Medicine

## 2019-03-28 ENCOUNTER — Ambulatory Visit (INDEPENDENT_AMBULATORY_CARE_PROVIDER_SITE_OTHER): Payer: 59 | Admitting: Family Medicine

## 2019-03-28 DIAGNOSIS — Z23 Encounter for immunization: Secondary | ICD-10-CM | POA: Diagnosis not present

## 2019-03-28 DIAGNOSIS — R0789 Other chest pain: Secondary | ICD-10-CM

## 2019-03-28 DIAGNOSIS — R928 Other abnormal and inconclusive findings on diagnostic imaging of breast: Secondary | ICD-10-CM | POA: Diagnosis not present

## 2019-03-28 DIAGNOSIS — Z720 Tobacco use: Secondary | ICD-10-CM

## 2019-03-28 DIAGNOSIS — E782 Mixed hyperlipidemia: Secondary | ICD-10-CM

## 2019-03-28 DIAGNOSIS — R05 Cough: Secondary | ICD-10-CM | POA: Diagnosis not present

## 2019-03-28 DIAGNOSIS — L659 Nonscarring hair loss, unspecified: Secondary | ICD-10-CM

## 2019-03-28 DIAGNOSIS — R5382 Chronic fatigue, unspecified: Secondary | ICD-10-CM

## 2019-03-28 DIAGNOSIS — R059 Cough, unspecified: Secondary | ICD-10-CM

## 2019-03-28 DIAGNOSIS — I10 Essential (primary) hypertension: Secondary | ICD-10-CM

## 2019-03-28 DIAGNOSIS — N644 Mastodynia: Secondary | ICD-10-CM

## 2019-03-28 DIAGNOSIS — E559 Vitamin D deficiency, unspecified: Secondary | ICD-10-CM

## 2019-03-28 DIAGNOSIS — Z1211 Encounter for screening for malignant neoplasm of colon: Secondary | ICD-10-CM

## 2019-03-28 MED ORDER — SHINGRIX 50 MCG/0.5ML IM SUSR: 0.5000 mL | Freq: Once | INTRAMUSCULAR | 0 refills | Status: AC

## 2019-03-28 MED ORDER — PHENTERMINE HCL 37.5 MG PO CAPS: 37.5000 mg | ORAL_CAPSULE | ORAL | 2 refills | Status: DC

## 2019-04-03 ENCOUNTER — Encounter: Payer: Self-pay | Admitting: Gastroenterology

## 2019-04-17 ENCOUNTER — Other Ambulatory Visit: Payer: Self-pay | Admitting: Family Medicine

## 2019-04-17 DIAGNOSIS — R928 Other abnormal and inconclusive findings on diagnostic imaging of breast: Secondary | ICD-10-CM

## 2019-04-17 DIAGNOSIS — N644 Mastodynia: Secondary | ICD-10-CM

## 2019-04-24 ENCOUNTER — Other Ambulatory Visit: Payer: Self-pay | Admitting: Family Medicine

## 2019-04-24 DIAGNOSIS — N644 Mastodynia: Secondary | ICD-10-CM

## 2019-04-24 DIAGNOSIS — R928 Other abnormal and inconclusive findings on diagnostic imaging of breast: Secondary | ICD-10-CM

## 2019-05-01 ENCOUNTER — Other Ambulatory Visit: Payer: 59

## 2019-05-04 ENCOUNTER — Other Ambulatory Visit: Payer: Self-pay

## 2019-05-04 ENCOUNTER — Ambulatory Visit (INDEPENDENT_AMBULATORY_CARE_PROVIDER_SITE_OTHER): Payer: 59 | Admitting: *Deleted

## 2019-05-04 DIAGNOSIS — Z1211 Encounter for screening for malignant neoplasm of colon: Secondary | ICD-10-CM

## 2019-05-04 MED ORDER — NA SULFATE-K SULFATE-MG SULF 17.5-3.13-1.6 GM/177ML PO SOLN: 1.0000 | Freq: Once | ORAL | 0 refills | Status: AC

## 2019-05-04 NOTE — Progress Notes (Signed)
Pt remembered that she takes Melatonin 6 to 9 mg every other day.  Added to medication list.

## 2019-05-04 NOTE — Patient Instructions (Signed)
Rhonda Merritt  04-Apr-1961 MRN: [id number]     Procedure Date: 08/04/2019 Time to register: 8:30 am Place to register: [facility] Procedure Time: 9:30 am Scheduled provider: Dr. Gala Romney    PREPARATION FOR COLONOSCOPY WITH SUPREP BOWEL PREP KIT  Note: Suprep Bowel Prep Kit is a split-dose (2day) regimen. Consumption of BOTH 6-ounce bottles is required for a complete prep.  Please notify us immediately if you are diabetic, take iron supplements, or if you are on Coumadin or any other blood thinners.  Please hold the following medications: n/a                                                                                                                                                  2 DAYS BEFORE PROCEDURE:  DATE: 08/02/2019   DAY: Wednesday Begin clear liquid diet AFTER your lunch meal. NO SOLID FOODS after this point.  1 DAY BEFORE PROCEDURE:  DATE: 08/03/2019   DAY: Thursday Continue clear liquids the entire day - NO SOLID FOOD.   Diabetic medications adjustments for today: n/a  At 6:00pm: Complete steps 1 through 4 below, using ONE (1) 6-ounce bottle, before going to bed. Step 1:  Pour ONE (1) 6-ounce bottle of SUPREP liquid into the mixing container.  Step 2:  Add cool drinking water to the 16 ounce line on the container and mix.  Note: Dilute the solution concentrate as directed prior to use. Step 3:  DRINK ALL the liquid in the container. Step 4:  You MUST drink an additional two (2) or more 16 ounce containers of water over the next one (1) hour.   Continue clear liquids.  DAY OF PROCEDURE:   DATE: 08/04/2019   DAY: Friday If you take medications for your heart, blood pressure, or breathing, you may take these medications.  Diabetic medications adjustments for today: n/a  5 hours before your procedure at:  4:30 am Step 1:  Pour ONE (1) 6-ounce bottle of SUPREP liquid into the mixing container.  Step 2:  Add cool drinking water to the 16 ounce line on the  container and mix.  Note: Dilute the solution concentrate as directed prior to use. Step 3:  DRINK ALL the liquid in the container. Step 4:  You MUST drink an additional two (2) or more 16 ounce containers of water over the next one (1) hour. You MUST complete the final glass of water at least 3 hours before your colonoscopy. Nothing by mouth past 6:30 am  You may take your morning medications with sip of water unless we have instructed otherwise.    Please see below for Dietary Information.  CLEAR LIQUIDS INCLUDE:  Water Jello (NOT red in color)   Ice Popsicles (NOT red in color)   Tea (sugar ok, no milk/cream) Powdered fruit flavored drinks  Coffee (sugar ok, no  milk/cream) Gatorade/ Lemonade/ Kool-Aid  (NOT red in color)   Juice: apple, white grape, white cranberry Soft drinks  Clear bullion, consomme, broth (fat free beef/chicken/vegetable)  Carbonated beverages (any kind)  Strained chicken noodle soup Hard Candy   Remember: Clear liquids are liquids that will allow you to see your fingers on the other side of a clear glass. Be sure liquids are NOT red in color, and not cloudy, but CLEAR.  DO NOT EAT OR DRINK ANY OF THE FOLLOWING:  Dairy products of any kind   Cranberry juice Tomato juice / V8 juice   Grapefruit juice Orange juice     Red grape juice  Do not eat any solid foods, including such foods as: cereal, oatmeal, yogurt, fruits, vegetables, creamed soups, eggs, bread, crackers, pureed foods in a blender, etc.   HELPFUL HINTS FOR DRINKING PREP SOLUTION:   Make sure prep is extremely cold. Mix and refrigerate the the morning of the prep. You may also put in the freezer.   You may try mixing some Crystal Light or Country Time Lemonade if you prefer. Mix in small amounts; add more if necessary.  Try drinking through a straw  Rinse mouth with water or a mouthwash between glasses, to remove after-taste.  Try sipping on a cold beverage /ice/ popsicles between glasses of  prep.  Place a piece of sugar-free hard candy in mouth between glasses.  If you become nauseated, try consuming smaller amounts, or stretch out the time between glasses. Stop for 30-60 minutes, then slowly start back drinking.     OTHER INSTRUCTIONS  You will need a responsible adult at least 57 years of age to accompany you and drive you home. This person must remain in the waiting room during your procedure. The hospital will cancel your procedure if you do not have a responsible adult with you.   1. Wear loose fitting clothing that is easily removed. 2. Leave jewelry and other valuables at home.  3. Remove all body piercing jewelry and leave at home. 4. Total time from sign-in until discharge is approximately 2-3 hours. 5. You should go home directly after your procedure and rest. You can resume normal activities the day after your procedure. 6. The day of your procedure you should not:  Drive  Make legal decisions  Operate machinery  Drink alcohol  Return to work   You may call the office (Dept: [phone]) before 5:00pm, or page the doctor on call [phone]) after 5:00pm, for further instructions, if necessary.   Insurance Information YOU WILL NEED TO CHECK WITH YOUR INSURANCE COMPANY FOR THE BENEFITS OF COVERAGE YOU HAVE FOR THIS PROCEDURE.  UNFORTUNATELY, NOT ALL INSURANCE COMPANIES HAVE BENEFITS TO COVER ALL OR PART OF THESE TYPES OF PROCEDURES.  IT IS YOUR RESPONSIBILITY TO CHECK YOUR BENEFITS, HOWEVER, WE WILL BE GLAD TO ASSIST YOU WITH ANY CODES YOUR INSURANCE COMPANY MAY NEED.    PLEASE NOTE THAT MOST INSURANCE COMPANIES WILL NOT COVER A SCREENING COLONOSCOPY FOR PEOPLE UNDER THE AGE OF 50  IF YOU HAVE BCBS INSURANCE, YOU MAY HAVE BENEFITS FOR A SCREENING COLONOSCOPY BUT IF POLYPS ARE FOUND THE DIAGNOSIS WILL CHANGE AND THEN YOU MAY HAVE A DEDUCTIBLE THAT WILL NEED TO BE MET. SO PLEASE MAKE SURE YOU CHECK YOUR BENEFITS FOR A SCREENING COLONOSCOPY AS WELL AS A  DIAGNOSTIC COLONOSCOPY.

## 2019-05-04 NOTE — Progress Notes (Signed)
Ok to schedule.

## 2019-05-04 NOTE — Progress Notes (Addendum)
Gastroenterology Pre-Procedure Review  Request Date: 05/04/2019 Requesting Physician: Hendricks Limes, NP, Last TCS 4 or more years ago done in [place] per pt, she could not remember physician who did it, polyps per pt  PATIENT REVIEW QUESTIONS: The patient responded to the following health history questions as indicated:    1. Diabetes Melitis: no 2. Joint replacements in the past 12 months: no 3. Major health problems in the past 3 months: no 4. Has an artificial valve or MVP: no 5. Has a defibrillator: no 6. Has been advised in past to take antibiotics in advance of a procedure like teeth cleaning: no 7. Family history of colon cancer: no  8. Alcohol Use: yes, wine or vodka on holidays 9. Illicit drug Use: no 10. History of sleep apnea: no, but pt thinks she has insomnia due to work shift change 11. History of coronary artery or other vascular stents placed within the last 12 months: no 12. History of any prior anesthesia complications: yes, mild anxiety 13. There is no height or weight on file to calculate BMI. ht: 5'3 wt: 197 lbs    MEDICATIONS & ALLERGIES:    Patient reports the following regarding taking any blood thinners:   Plavix? no Aspirin? no Coumadin? no Brilinta? no Xarelto? no Eliquis? no Pradaxa? no Savaysa? no Effient? no  Patient confirms/reports the following medications:  Current Outpatient Medications  Medication Sig Dispense Refill  . albuterol (VENTOLIN HFA) 108 (90 Base) MCG/ACT inhaler Inhale 1-2 puffs into the lungs every 6 (six) hours as needed for shortness of breath or wheezing.    . Black Cohosh 40 MG CAPS Take 1-2 capsules by mouth daily.    . ergocalciferol (VITAMIN D2) 1.25 MG (50000 UT) capsule Take 1 capsule (50,000 Units total) by mouth once a week. 12 capsule 1  . losartan-hydrochlorothiazide (HYZAAR) 100-25 MG tablet Take 1 tablet by mouth daily.    . meclizine (ANTIVERT) 25 MG tablet Take 25 mg by mouth as needed for dizziness.     .  phentermine 37.5 MG capsule Take 1 capsule (37.5 mg total) by mouth every morning. 30 capsule 2  . Pumpkin Seed-Soy Germ (AZO BLADDER CONTROL/GO-LESS PO) Take by mouth as needed.     No current facility-administered medications for this visit.    Patient confirms/reports the following allergies:  Allergies  Allergen Reactions  . Asa [Aspirin] Hives  . Cefadroxil Hives  . Contrast Media [Iodinated Diagnostic Agents] Hives  . Imitrex [Sumatriptan] Hives    No orders of the defined types were placed in this encounter.   AUTHORIZATION INFORMATION Primary Insurance: [facility],  [place] #: [id number],  Group #: [id number] Pre-Cert / Auth required: Yes, approved 08-04-19- 11-02-19 per Delsa Sale Ref# [id number] Pre-Cert / Josem Kaufmann #: Auth # [id number]  SCHEDULE INFORMATION: Procedure has been scheduled as follows:  Date: 08/04/2019, Time: 9:30 Location: [facility] with Dr. Gala Romney  This Gastroenterology Pre-Precedure Review Form is being routed to the following provider(s): Walden Field, NP

## 2019-05-05 ENCOUNTER — Other Ambulatory Visit: Payer: 59

## 2019-05-05 NOTE — Addendum Note (Signed)
Addended by: Metro Kung on: 05/05/2019 08:45 AM   Modules accepted: Orders, SmartSet

## 2019-05-09 ENCOUNTER — Ambulatory Visit
Admission: RE | Admit: 2019-05-09 | Discharge: 2019-05-09 | Disposition: A | Discharge status: Home / Self Care | Payer: 59 | Source: Ambulatory Visit | Attending: Family Medicine | Admitting: Family Medicine

## 2019-05-09 ENCOUNTER — Other Ambulatory Visit: Payer: Self-pay | Admitting: Family Medicine

## 2019-05-09 ENCOUNTER — Other Ambulatory Visit: Payer: Self-pay

## 2019-05-09 DIAGNOSIS — R928 Other abnormal and inconclusive findings on diagnostic imaging of breast: Secondary | ICD-10-CM

## 2019-05-09 DIAGNOSIS — N644 Mastodynia: Secondary | ICD-10-CM

## 2019-05-09 DIAGNOSIS — N6310 Unspecified lump in the right breast, unspecified quadrant: Secondary | ICD-10-CM

## 2019-07-08 ENCOUNTER — Ambulatory Visit: Payer: 59 | Attending: Internal Medicine

## 2019-07-08 DIAGNOSIS — Z23 Encounter for immunization: Secondary | ICD-10-CM

## 2019-07-08 NOTE — Progress Notes (Signed)
   Covid-19 Vaccination Clinic  Name:  Rhonda Merritt    MRN: [id number] DOB: Apr 30, 1960  07/08/2019  [name] was observed post Covid-19 immunization for 15 minutes without incident. She was provided with Vaccine Information Sheet and instruction to access the V-Safe system.   [name] was instructed to call 911 with any severe reactions post vaccine: Marland Kitchen Difficulty breathing  . Swelling of face and throat  . A fast heartbeat  . A bad rash all over body  . Dizziness and weakness   Immunizations Administered    Name Date Dose VIS Date Route   Moderna COVID-19 Vaccine 07/08/2019 11:45 AM 0.5 mL 03/28/2019 Intramuscular   Manufacturer: Moderna   Lot: [id number]   [id number]

## 2019-07-19 ENCOUNTER — Telehealth: Payer: Self-pay | Admitting: Family Medicine

## 2019-07-19 NOTE — Telephone Encounter (Signed)
Patient will call back to follow up and make appt with Blanch Media

## 2019-07-19 NOTE — Telephone Encounter (Signed)
Ac lmtcb  

## 2019-07-24 ENCOUNTER — Other Ambulatory Visit: Payer: 59

## 2019-08-01 ENCOUNTER — Other Ambulatory Visit (HOSPITAL_COMMUNITY)
Admission: RE | Admit: 2019-08-01 | Discharge: 2019-08-01 | Disposition: A | Discharge status: Home / Self Care | Payer: 59 | Source: Ambulatory Visit | Attending: Internal Medicine | Admitting: Internal Medicine

## 2019-08-01 ENCOUNTER — Other Ambulatory Visit: Payer: Self-pay

## 2019-08-01 ENCOUNTER — Telehealth: Payer: Self-pay | Admitting: *Deleted

## 2019-08-01 NOTE — Telephone Encounter (Signed)
Called pt and informed her of EG's recommendations.  She was informed that the medication they give her from the procedure will take care of the anxiety. She was also informed that EG will leave it up to RMR if pre-procedure medication is warranted.  Pt voiced understanding.

## 2019-08-01 NOTE — Telephone Encounter (Addendum)
Spoke with pt this morning and she informed me that she wanted RMR to know that she gets anxiety before a procedure and usually requires medication.  Informed pt that I would pass this info along and reminded pt to inform Day Surgery upon arrival.  Routing to RMR as a FYI.  Also, routed to EG in RMR's absence.  Pt is scheduled for a colonoscopy on 08/04/2019.

## 2019-08-01 NOTE — Telephone Encounter (Signed)
The medication they give her from the procedure will take care of the anxiety. I will leave it up to RMR if pre-procedure medication is warranted.

## 2019-08-02 ENCOUNTER — Telehealth: Payer: Self-pay | Admitting: Internal Medicine

## 2019-08-02 ENCOUNTER — Other Ambulatory Visit (HOSPITAL_COMMUNITY)
Admission: RE | Admit: 2019-08-02 | Discharge: 2019-08-02 | Disposition: A | Discharge status: Home / Self Care | Payer: 59 | Source: Ambulatory Visit | Attending: Internal Medicine | Admitting: Internal Medicine

## 2019-08-02 ENCOUNTER — Other Ambulatory Visit (HOSPITAL_COMMUNITY): Payer: 59

## 2019-08-02 DIAGNOSIS — Z01812 Encounter for preprocedural laboratory examination: Secondary | ICD-10-CM | POA: Insufficient documentation

## 2019-08-02 DIAGNOSIS — Z20822 Contact with and (suspected) exposure to covid-19: Secondary | ICD-10-CM | POA: Diagnosis not present

## 2019-08-02 LAB — SARS CORONAVIRUS 2 (TAT 6-24 HRS): SARS Coronavirus 2: NEGATIVE

## 2019-08-02 NOTE — Telephone Encounter (Signed)
[employer] and informed her of what happened to pt.  Ok to have Covid screening today in [place] per Bracey.  Called pt back and she is aware that she can still have it done today but in [place].  Scheduled pt for 3:45 today and she is aware.  Pt voiced understanding.

## 2019-08-02 NOTE — Telephone Encounter (Signed)
Threasa Beards called to let us know patient was a no show for her covid test yesterday and is scheduled with RMR on Friday.

## 2019-08-02 NOTE — Telephone Encounter (Signed)
Called patient. She states she was not told covid test was changed from today (as originally scheduled) to yesterday.

## 2019-08-04 ENCOUNTER — Ambulatory Visit (HOSPITAL_COMMUNITY)
Admission: RE | Admit: 2019-08-04 | Discharge: 2019-08-04 | Disposition: A | Discharge status: Home / Self Care | Payer: 59 | Attending: Internal Medicine | Admitting: Internal Medicine

## 2019-08-04 ENCOUNTER — Other Ambulatory Visit: Payer: Self-pay

## 2019-08-04 ENCOUNTER — Encounter (HOSPITAL_COMMUNITY)
Admission: RE | Disposition: A | Discharge status: Home / Self Care | Payer: Self-pay | Source: Home / Self Care | Attending: Internal Medicine

## 2019-08-04 ENCOUNTER — Encounter (HOSPITAL_COMMUNITY): Payer: Self-pay | Admitting: Internal Medicine

## 2019-08-04 DIAGNOSIS — I10 Essential (primary) hypertension: Secondary | ICD-10-CM | POA: Diagnosis not present

## 2019-08-04 DIAGNOSIS — K640 First degree hemorrhoids: Secondary | ICD-10-CM | POA: Diagnosis not present

## 2019-08-04 DIAGNOSIS — D124 Benign neoplasm of descending colon: Secondary | ICD-10-CM | POA: Insufficient documentation

## 2019-08-04 DIAGNOSIS — E785 Hyperlipidemia, unspecified: Secondary | ICD-10-CM | POA: Insufficient documentation

## 2019-08-04 DIAGNOSIS — Z1211 Encounter for screening for malignant neoplasm of colon: Secondary | ICD-10-CM | POA: Diagnosis present

## 2019-08-04 DIAGNOSIS — Z86010 Personal history of colonic polyps: Secondary | ICD-10-CM | POA: Diagnosis not present

## 2019-08-04 DIAGNOSIS — K635 Polyp of colon: Secondary | ICD-10-CM | POA: Diagnosis not present

## 2019-08-04 DIAGNOSIS — Z7982 Long term (current) use of aspirin: Secondary | ICD-10-CM | POA: Insufficient documentation

## 2019-08-04 DIAGNOSIS — M1990 Unspecified osteoarthritis, unspecified site: Secondary | ICD-10-CM | POA: Insufficient documentation

## 2019-08-04 DIAGNOSIS — F17210 Nicotine dependence, cigarettes, uncomplicated: Secondary | ICD-10-CM | POA: Diagnosis not present

## 2019-08-04 HISTORY — PX: COLONOSCOPY: SHX5424

## 2019-08-04 HISTORY — PX: POLYPECTOMY: SHX5525

## 2019-08-04 SURGERY — COLONOSCOPY
Anesthesia: Moderate Sedation

## 2019-08-04 MED ORDER — ONDANSETRON HCL 4 MG/2ML IJ SOLN
INTRAMUSCULAR | Status: DC | PRN
  Administered 2019-08-04: 4 mg via INTRAVENOUS

## 2019-08-04 MED ORDER — MEPERIDINE HCL 100 MG/ML IJ SOLN
INTRAMUSCULAR | Status: DC | PRN
  Administered 2019-08-04: 15 mg via INTRAVENOUS
  Administered 2019-08-04: 25 mg

## 2019-08-04 MED ORDER — MIDAZOLAM HCL 5 MG/5ML IJ SOLN
INTRAMUSCULAR | Status: DC | PRN
  Administered 2019-08-04: 1 mg via INTRAVENOUS
  Administered 2019-08-04: 2 mg via INTRAVENOUS
  Administered 2019-08-04 (×2): 1 mg via INTRAVENOUS

## 2019-08-04 MED ORDER — ONDANSETRON HCL 4 MG/2ML IJ SOLN
INTRAMUSCULAR | Status: AC
  Filled 2019-08-04: qty 2

## 2019-08-04 MED ORDER — MIDAZOLAM HCL 5 MG/5ML IJ SOLN
INTRAMUSCULAR | Status: AC
  Filled 2019-08-04: qty 10

## 2019-08-04 MED ORDER — MEPERIDINE HCL 50 MG/ML IJ SOLN
INTRAMUSCULAR | Status: AC
  Filled 2019-08-04: qty 1

## 2019-08-04 MED ORDER — SODIUM CHLORIDE 0.9 % IV SOLN: INTRAVENOUS | Status: DC

## 2019-08-04 NOTE — Op Note (Signed)
[facility] Patient Name: Rhonda Merritt Procedure Date: 08/04/2019 9:27 AM MRN: [id number] Date of Birth: 1960-07-24 Attending MD: Norvel Richards , MD CSN: [id number] Age: 59 Admit Type: Outpatient Procedure:                Colonoscopy Indications:              High risk colon cancer surveillance: Personal                            history of colonic polyps Providers:                Norvel Richards, MD, Janeece Riggers, RN, Randa Spike, Technician Referring MD:              Medicines:                Midazolam 5 mg IV, Meperidine 40 mg IV Complications:            No immediate complications. Estimated Blood Loss:     Estimated blood loss was minimal. Procedure:                Pre-Anesthesia Assessment:                           - Prior to the procedure, a History and Physical                            was performed, and patient medications and                            allergies were reviewed. The patient's tolerance of                            previous anesthesia was also reviewed. The risks                            and benefits of the procedure and the sedation                            options and risks were discussed with the patient.                            All questions were answered, and informed consent                            was obtained. Prior Anticoagulants: The patient has                            taken no previous anticoagulant or antiplatelet                            agents. ASA Grade Assessment: II - A patient with  mild systemic disease. After reviewing the risks                            and benefits, the patient was deemed in                            satisfactory condition to undergo the procedure.                           After obtaining informed consent, the colonoscope                            was passed under direct vision. Throughout the   procedure, the patient's blood pressure, pulse, and                            oxygen saturations were monitored continuously. The                            [id number] scope was introduced through                            the anus and advanced to the the cecum, identified                            by appendiceal orifice and ileocecal valve. The                            colonoscopy was performed without difficulty. The                            patient tolerated the procedure well. The quality                            of the bowel preparation was adequate. Scope In: 9:52:50 AM Scope Out: 10:03:34 AM Scope Withdrawal Time: 0 hours 8 minutes 12 seconds  Total Procedure Duration: 0 hours 10 minutes 44 seconds  Findings:      The perianal and digital rectal examinations were normal. Grade 1       internal hemorrhoids      A 8 mm polyp was found in the descending colon. The polyp was       semi-pedunculated. The polyp was removed with a cold snare. Resection       and retrieval were complete. Estimated blood loss was minimal.      The exam was otherwise without abnormality on direct and retroflexion       views. Impression:               - One 8 mm polyp in the descending colon, removed                            with a cold snare. Resected and retrieved. Grade 1                            internal hemorrhoids.                           -  The examination was otherwise normal on direct                            and retroflexion views. Moderate Sedation:      Moderate (conscious) sedation was administered by the endoscopy nurse       and supervised by the endoscopist. The following parameters were       monitored: oxygen saturation, heart rate, blood pressure, respiratory       rate, EKG, adequacy of pulmonary ventilation, and response to care.       Total physician intraservice time was 15 minutes. Recommendation:           - Patient has a contact number available for                             emergencies. The signs and symptoms of potential                            delayed complications were discussed with the                            patient. Return to normal activities tomorrow.                            Written discharge instructions were provided to the                            patient.                           - Advance diet as tolerated.                           - Continue present medications.                           - Repeat colonoscopy after studies are complete for                            surveillance based on pathology results.                           - Return to GI office (date not yet determined). Procedure Code(s):        --- Professional ---                           [phone], Colonoscopy, flexible; with removal of                            tumor(s), polyp(s), or other lesion(s) by snare                            technique                           G0500, Moderate sedation services provided by the  same physician or other qualified health care                            professional performing a gastrointestinal                            endoscopic service that sedation supports,                            requiring the presence of an independent trained                            observer to assist in the monitoring of the                            patient's level of consciousness and physiological                            status; initial 15 minutes of intra-service time;                            patient age 22 years or older (additional time may                            be reported with [phone], as appropriate) Diagnosis Code(s):        --- Professional ---                           Z86.010, Personal history of colonic polyps                           K63.5, Polyp of colon CPT copyright 2019 American Medical Association. All rights reserved. The codes documented in this report are preliminary  and upon coder review may  be revised to meet current compliance requirements. Cristopher Estimable. Gervis Gaba, MD Norvel Richards, MD 08/04/2019 10:14:19 AM This report has been signed electronically. Number of Addenda: 0

## 2019-08-04 NOTE — Discharge Instructions (Signed)
Colonoscopy Discharge Instructions  Read the instructions outlined below and refer to this sheet in the next few weeks. These discharge instructions provide you with general information on caring for yourself after you leave the hospital. Your doctor may also give you specific instructions. While your treatment has been planned according to the most current medical practices available, unavoidable complications occasionally occur. If you have any problems or questions after discharge, call Dr. Gala Romney at [phone]. ACTIVITY  You may resume your regular activity, but move at a slower pace for the next 24 hours.   Take frequent rest periods for the next 24 hours.   Walking will help get rid of the air and reduce the bloated feeling in your belly (abdomen).   No driving for 24 hours (because of the medicine (anesthesia) used during the test).    Do not sign any important legal documents or operate any machinery for 24 hours (because of the anesthesia used during the test).  NUTRITION  Drink plenty of fluids.   You may resume your normal diet as instructed by your doctor.   Begin with a light meal and progress to your normal diet. Heavy or fried foods are harder to digest and may make you feel sick to your stomach (nauseated).   Avoid alcoholic beverages for 24 hours or as instructed.  MEDICATIONS  You may resume your normal medications unless your doctor tells you otherwise.  WHAT YOU CAN EXPECT TODAY  Some feelings of bloating in the abdomen.   Passage of more gas than usual.   Spotting of blood in your stool or on the toilet paper.  IF YOU HAD POLYPS REMOVED DURING THE COLONOSCOPY:  No aspirin products for 7 days or as instructed.   No alcohol for 7 days or as instructed.   Eat a soft diet for the next 24 hours.  FINDING OUT THE RESULTS OF YOUR TEST Not all test results are available during your visit. If your test results are not back during the visit, make an appointment  with your caregiver to find out the results. Do not assume everything is normal if you have not heard from your caregiver or the medical facility. It is important for you to follow up on all of your test results.  SEEK IMMEDIATE MEDICAL ATTENTION IF:  You have more than a spotting of blood in your stool.   Your belly is swollen (abdominal distention).   You are nauseated or vomiting.   You have a temperature over 101.   You have abdominal pain or discomfort that is severe or gets worse throughout the day.    Colon Polyps  Polyps are tissue growths inside the body. Polyps can grow in many places, including the large intestine (colon). A polyp may be a round bump or a mushroom-shaped growth. You could have one polyp or several. Most colon polyps are noncancerous (benign). However, some colon polyps can become cancerous over time. Finding and removing the polyps early can help prevent this. What are the causes? The exact cause of colon polyps is not known. What increases the risk? You are more likely to develop this condition if you:  Have a family history of colon cancer or colon polyps.  Are older than 67 or older than 45 if you are African American.  Have inflammatory bowel disease, such as ulcerative colitis or Crohn's disease.  Have certain hereditary conditions, such as: ? Familial adenomatous polyposis. ? Lynch syndrome. ? Turcot syndrome. ? Peutz-Jeghers syndrome.  Are  overweight.  Smoke cigarettes.  Do not get enough exercise.  Drink too much alcohol.  Eat a diet that is high in fat and red meat and low in fiber.  Had childhood cancer that was treated with abdominal radiation. What are the signs or symptoms? Most polyps do not cause symptoms. If you have symptoms, they may include:  Blood coming from your rectum when having a bowel movement.  Blood in your stool. The stool may look dark red or black.  Abdominal pain.  A change in bowel habits, such as  constipation or diarrhea. How is this diagnosed? This condition is diagnosed with a colonoscopy. This is a procedure in which a lighted, flexible scope is inserted into the anus and then passed into the colon to examine the area. Polyps are sometimes found when a colonoscopy is done as part of routine cancer screening tests. How is this treated? Treatment for this condition involves removing any polyps that are found. Most polyps can be removed during a colonoscopy. Those polyps will then be tested for cancer. Additional treatment may be needed depending on the results of testing. Follow these instructions at home: Lifestyle  Maintain a healthy weight, or lose weight if recommended by your health care provider.  Exercise every day or as told by your health care provider.  Do not use any products that contain nicotine or tobacco, such as cigarettes and e-cigarettes. If you need help quitting, ask your health care provider.  If you drink alcohol, limit how much you have: ? 0-1 drink a day for women. ? 0-2 drinks a day for men.  Be aware of how much alcohol is in your drink. In the [place] one drink equals one 12 oz bottle of beer (355 mL), one 5 oz glass of wine (148 mL), or one 1 oz shot of hard liquor (44 mL). Eating and drinking   Eat foods that are high in fiber, such as fruits, vegetables, and whole grains.  Eat foods that are high in calcium and vitamin D, such as milk, cheese, yogurt, eggs, liver, fish, and broccoli.  Limit foods that are high in fat, such as fried foods and desserts.  Limit the amount of red meat and processed meat you eat, such as hot dogs, sausage, bacon, and lunch meats. General instructions  Keep all follow-up visits as told by your health care provider. This is important. ? This includes having regularly scheduled colonoscopies. ? Talk to your health care provider about when you need a colonoscopy. Contact a health care provider if:  You have new or  worsening bleeding during a bowel movement.  You have new or increased blood in your stool.  You have a change in bowel habits.  You lose weight for no known reason. Summary  Polyps are tissue growths inside the body. Polyps can grow in many places, including the colon.  Most colon polyps are noncancerous (benign), but some can become cancerous over time.  This condition is diagnosed with a colonoscopy.  Treatment for this condition involves removing any polyps that are found. Most polyps can be removed during a colonoscopy. This information is not intended to replace advice given to you by your health care provider. Make sure you discuss any questions you have with your health care provider. Document Revised: 07/29/2017 Document Reviewed: 07/29/2017 Elsevier Patient Education  [place].   1 polyp removed from your colon today.  Further recommendations to follow pending review of pathology report  At patient request  I called Broadus John at [phone] and reviewed results

## 2019-08-04 NOTE — H&P (Signed)
@LOGO @   Primary Care Physician:  Loman Brooklyn, FNP Primary Gastroenterologist:  Dr. Gala Romney  Pre-Procedure History & Physical: HPI:  Rhonda Merritt is a 59 y.o. female here for surveillance colonoscopy.  History of colonic polyps removed elsewhere years ago.  Here for surveillance colonoscopy.  No bowel symptoms currently.  Past Medical History:  Diagnosis Date  . Chest wall pain   . Chronic fatigue   . Elevated blood sugar   . Essential hypertension   . History of colonic polyps   . Hyperlipidemia   . OAB (overactive bladder)   . Osteoarthritis   . Vertigo   . Vitamin D deficiency     Past Surgical History:  Procedure Laterality Date  . ABDOMINAL HYSTERECTOMY    . CHOLECYSTECTOMY    . COLONOSCOPY  2015    Prior to Admission medications   Medication Sig Start Date End Date Taking? Authorizing Provider  albuterol (VENTOLIN HFA) 108 (90 Base) MCG/ACT inhaler Inhale 1-2 puffs into the lungs every 6 (six) hours as needed for shortness of breath or wheezing.   Yes [provider]  Black Cohosh 40 MG CAPS Take 80 mg by mouth daily.    Yes [provider]  ergocalciferol (VITAMIN D2) 1.25 MG (50000 UT) capsule Take 1 capsule (50,000 Units total) by mouth once a week. Patient taking differently: Take 50,000 Units by mouth 2 (two) times a week.  02/28/19  Yes Hendricks Limes F, FNP  Glycerin-Hypromellose-PEG 400 ([employer] DRY EYE RELIEF) 0.2-0.2-1 % SOLN Place 1 drop into both eyes daily.   Yes [provider]  losartan-hydrochlorothiazide (HYZAAR) 100-25 MG tablet Take 1 tablet by mouth daily.   Yes [provider]  meclizine (ANTIVERT) 25 MG tablet Take 25 mg by mouth daily as needed for dizziness.    Yes [provider]  phentermine 37.5 MG capsule Take 1 capsule (37.5 mg total) by mouth every morning. Patient taking differently: Take 37.5 mg by mouth daily.  03/28/19  Yes Hendricks Limes F, FNP  Pumpkin Seed-Soy Germ (AZO BLADDER  CONTROL/GO-LESS PO) Take 1 tablet by mouth daily.    Yes [provider]  sodium chloride (AYR) 0.65 % nasal spray Place 1 spray into the nose daily.   Yes [provider]    Allergies as of 05/05/2019 - Review Complete 05/04/2019  Allergen Reaction Noted  . Asa [aspirin] Hives 01/29/2015  . Cefadroxil Hives 02/28/2019  . Contrast media [iodinated diagnostic agents] Hives 01/29/2015  . Imitrex [sumatriptan] Hives 01/29/2015    Family History  Problem Relation Age of Onset  . Heart disease Father   . Hypertension Father   . Diabetes Mellitus II Father   . Ovarian cancer Sister   . Diabetes Mellitus II Maternal Grandmother   . Breast cancer Sister   . Ovarian cancer Sister   . Multiple sclerosis Mother   . Arrhythmia Daughter     Social History   Socioeconomic History  . Marital status: Married    Spouse name: Not on file  . Number of children: Not on file  . Years of education: Not on file  . Highest education level: Not on file  Occupational History  . Not on file  Tobacco Use  . Smoking status: Current Every Day Smoker    Types: Cigarettes  . Smokeless tobacco: Never Used  . Tobacco comment: 7 ciggs per day   Substance and Sexual Activity  . Alcohol use: Yes    Comment: Occasional on special occasions  .  Drug use: No  . Sexual activity: Not on file  Other Topics Concern  . Not on file  Social History Narrative  . Not on file   Social Determinants of Health   Financial Resource Strain:   . Difficulty of Paying Living Expenses:   Food Insecurity:   . Worried About Charity fundraiser in the Last Year:   . Arboriculturist in the Last Year:   Transportation Needs:   . Film/video editor (Medical):   Marland Kitchen Lack of Transportation (Non-Medical):   Physical Activity:   . Days of Exercise per Week:   . Minutes of Exercise per Session:   Stress:   . Feeling of Stress :   Social Connections:   . Frequency of Communication with Friends and  Family:   . Frequency of Social Gatherings with Friends and Family:   . Attends Religious Services:   . Active Member of Clubs or Organizations:   . Attends Archivist Meetings:   Marland Kitchen Marital Status:   Intimate Partner Violence:   . Fear of Current or Ex-Partner:   . Emotionally Abused:   Marland Kitchen Physically Abused:   . Sexually Abused:     Review of Systems: See HPI, otherwise negative ROS  Physical Exam: BP 124/80   Pulse 72   Temp (!) 97.5 F (36.4 C) (Oral)   Resp 18   Ht 5\' 5"  (1.651 m)   SpO2 100%   BMI 34.48 kg/m  General:   Alert,  Well-developed, well-nourished, pleasant and cooperative in NAD Mouth:  No deformity or lesions. Neck:  Supple; no masses or thyromegaly. No significant cervical adenopathy. Lungs:  Clear throughout to auscultation.   No wheezes, crackles, or rhonchi. No acute distress. Heart:  Regular rate and rhythm; no murmurs, clicks, rubs,  or gallops. Abdomen: Non-distended, normal bowel sounds.  Soft and nontender without appreciable mass or hepatosplenomegaly.  Pulses:  Normal pulses noted. Extremities:  Without clubbing or edema.  Impression/Plan: 59 year old lady with history colonic polyps.  Here for colonoscopy for surveillance purposes. The risks, benefits, limitations, alternatives and imponderables have been reviewed with the patient. Questions have been answered. All parties are agreeable.      Notice: This dictation was prepared with Dragon dictation along with smaller phrase technology. Any transcriptional errors that result from this process are unintentional and may not be corrected upon review.

## 2019-08-07 ENCOUNTER — Encounter: Payer: Self-pay | Admitting: Internal Medicine

## 2019-08-07 LAB — SURGICAL PATHOLOGY

## 2019-08-09 ENCOUNTER — Ambulatory Visit: Payer: 59 | Attending: Internal Medicine

## 2019-08-09 DIAGNOSIS — Z23 Encounter for immunization: Secondary | ICD-10-CM

## 2019-08-09 NOTE — Progress Notes (Signed)
   Covid-19 Vaccination Clinic  Name:  Rhonda Merritt    MRN: [id number] DOB: 10-Mar-1961  08/09/2019  Rhonda Merritt was observed post Covid-19 immunization for 15 minutes without incident. She was provided with Vaccine Information Sheet and instruction to access the V-Safe system.   Rhonda Merritt was instructed to call 911 with any severe reactions post vaccine: Marland Kitchen Difficulty breathing  . Swelling of face and throat  . A fast heartbeat  . A bad rash all over body  . Dizziness and weakness   Immunizations Administered    Name Date Dose VIS Date Route   Moderna COVID-19 Vaccine 08/09/2019  8:54 AM 0.5 mL 03/28/2019 Intramuscular   Manufacturer: Moderna   Lot: [id number]   [id number]

## 2019-08-22 ENCOUNTER — Other Ambulatory Visit: Payer: Self-pay | Admitting: *Deleted

## 2019-08-22 MED ORDER — LOSARTAN POTASSIUM-HCTZ 100-25 MG PO TABS: 1.0000 | ORAL_TABLET | Freq: Every day | ORAL | 1 refills | Status: DC

## 2019-08-22 NOTE — Telephone Encounter (Signed)
°  Prescription Request  08/22/2019  What is the name of the medication or equipment? losartan-hydrochlorothiazide (HYZAAR) 100-25 MG tablet   Have you contacted your pharmacy to request a refill? (if applicable) YES  Which pharmacy would you like this sent to? [facility] [place]    Patient notified that their request is being sent to the clinical staff for review and that they should receive a response within 2 business days.

## 2019-09-15 ENCOUNTER — Ambulatory Visit (INDEPENDENT_AMBULATORY_CARE_PROVIDER_SITE_OTHER): Payer: 59 | Admitting: Family Medicine

## 2019-09-15 ENCOUNTER — Encounter: Payer: Self-pay | Admitting: Family Medicine

## 2019-09-15 ENCOUNTER — Other Ambulatory Visit: Payer: Self-pay

## 2019-09-15 VITALS — BP 106/76 | HR 86 | Temp 97.7°F | Ht 65.0 in | Wt 212.0 lb

## 2019-09-15 DIAGNOSIS — Z720 Tobacco use: Secondary | ICD-10-CM

## 2019-09-15 DIAGNOSIS — E559 Vitamin D deficiency, unspecified: Secondary | ICD-10-CM

## 2019-09-15 DIAGNOSIS — B359 Dermatophytosis, unspecified: Secondary | ICD-10-CM

## 2019-09-15 DIAGNOSIS — I10 Essential (primary) hypertension: Secondary | ICD-10-CM

## 2019-09-15 DIAGNOSIS — R5382 Chronic fatigue, unspecified: Secondary | ICD-10-CM

## 2019-09-15 DIAGNOSIS — E669 Obesity, unspecified: Secondary | ICD-10-CM

## 2019-09-15 DIAGNOSIS — E782 Mixed hyperlipidemia: Secondary | ICD-10-CM

## 2019-09-15 MED ORDER — PHENTERMINE HCL 37.5 MG PO TABS: 37.5000 mg | ORAL_TABLET | Freq: Every day | ORAL | 0 refills | Status: DC

## 2019-09-15 MED ORDER — KETOCONAZOLE 2 % EX CREA: 1.0000 "application " | TOPICAL_CREAM | Freq: Every day | CUTANEOUS | 0 refills | Status: DC

## 2019-09-15 NOTE — Progress Notes (Signed)
Assessment & Plan:  1. Obesity (BMI 35.0-39.9 without comorbidity) - Will try phentermine again with tablets this time, instead of capsules. - phentermine (ADIPEX-P) 37.5 MG tablet; Take 1 tablet (37.5 mg total) by mouth daily before breakfast.  Dispense: 30 tablet; Refill: 0  2. Ringworm - ketoconazole (NIZORAL) 2 % cream; Apply 1 application topically daily.  Dispense: 60 g; Refill: 0  3. Essential hypertension - Well controlled on current regimen.  - Lipid panel - CMP14+EGFR  4. Mixed hyperlipidemia - Well controlled on current regimen.  - Lipid panel - CMP14+EGFR  5. Vitamin D deficiency - Well controlled on current regimen.  - VITAMIN D 25 Hydroxy (Vit-D Deficiency, Fractures)  6. Chronic fatigue - TSH  7. Tobacco use - CBC with Differential/Platelet   Return in about 4 weeks (around 10/13/2019) for weight.  Hendricks Limes, MSN, APRN, FNP-C [facility]  Subjective:    Patient ID: Rhonda Merritt, female    DOB: 1961/03/01, 59 y.o.   MRN: [id number]  Patient Care Team: Loman Brooklyn, FNP as PCP - General (Family Medicine) Danie Binder, MD (Inactive) as Consulting Physician (Gastroenterology)   Chief Complaint:  Chief Complaint  Patient presents with  . Weight Loss    6 month follow up    HPI: Rhonda Merritt is a 59 y.o. female presenting on 09/15/2019 for Weight Loss (6 month follow up)  Patient reports she did take the phentermine and was losing weight.  When she stopped them she did not come back in because they were causing her to have the jitters and constipation.  She states this did not happen when she was taking a tablet because she would adjust to either half or whole tablet depending on how her week was going.  When she was doing well and eating small portions she would only take half a tablet.  She has gained the weight back since she is no longer taking it.   New complaints: Patient has a rash under her left arm that  appeared several months ago.  She denies any itching or irritation.  It has not changed since she first noticed it.  No home treatments tried.   Social history:  Relevant past medical, surgical, family and social history reviewed and updated as indicated. Interim medical history since our last visit reviewed.  Allergies and medications reviewed and updated.  DATA REVIEWED: CHART IN EPIC  ROS: Negative unless specifically indicated above in HPI.    Current Outpatient Medications:  .  albuterol (VENTOLIN HFA) 108 (90 Base) MCG/ACT inhaler, Inhale 1-2 puffs into the lungs every 6 (six) hours as needed for shortness of breath or wheezing., Disp: , Rfl:  .  Black Cohosh 40 MG CAPS, Take 80 mg by mouth daily. , Disp: , Rfl:  .  ergocalciferol (VITAMIN D2) 1.25 MG (50000 UT) capsule, Take 1 capsule (50,000 Units total) by mouth once a week. (Patient taking differently: Take 50,000 Units by mouth 2 (two) times a week. ), Disp: 12 capsule, Rfl: 1 .  Glycerin-Hypromellose-PEG 400 ([employer] DRY EYE RELIEF) 0.2-0.2-1 % SOLN, Place 1 drop into both eyes daily., Disp: , Rfl:  .  losartan-hydrochlorothiazide (HYZAAR) 100-25 MG tablet, Take 1 tablet by mouth daily., Disp: 90 tablet, Rfl: 1 .  meclizine (ANTIVERT) 25 MG tablet, Take 25 mg by mouth daily as needed for dizziness. , Disp: , Rfl:  .  Pumpkin Seed-Soy Germ (AZO BLADDER CONTROL/GO-LESS PO), Take 1 tablet by mouth daily. , Disp: ,  Rfl:  .  sodium chloride (AYR) 0.65 % nasal spray, Place 1 spray into the nose daily., Disp: , Rfl:  .  ketoconazole (NIZORAL) 2 % cream, Apply 1 application topically daily., Disp: 60 g, Rfl: 0 .  phentermine (ADIPEX-P) 37.5 MG tablet, Take 1 tablet (37.5 mg total) by mouth daily before breakfast., Disp: 30 tablet, Rfl: 0   Allergies  Allergen Reactions  . Asa [Aspirin] Hives  . Cefadroxil Hives  . Contrast Media [Iodinated Diagnostic Agents] Hives  . Imitrex [Sumatriptan] Palpitations and Other (See Comments)     Flushing   Past Medical History:  Diagnosis Date  . Chest wall pain   . Chronic fatigue   . Elevated blood sugar   . Essential hypertension   . History of colonic polyps   . Hyperlipidemia   . OAB (overactive bladder)   . Osteoarthritis   . Vertigo   . Vitamin D deficiency     Past Surgical History:  Procedure Laterality Date  . ABDOMINAL HYSTERECTOMY    . CHOLECYSTECTOMY    . COLONOSCOPY  2015  . COLONOSCOPY N/A 08/04/2019   Procedure: COLONOSCOPY;  Surgeon: Daneil Dolin, MD;  [facility]: [facility];  Service: Endoscopy;  Laterality: N/A;  9:30  . POLYPECTOMY  08/04/2019   Procedure: POLYPECTOMY;  Surgeon: Daneil Dolin, MD;  Location: [facility];  Service: Endoscopy;;    Social History   Socioeconomic History  . Marital status: Married    Spouse name: Not on file  . Number of children: Not on file  . Years of education: Not on file  . Highest education level: Not on file  Occupational History  . Not on file  Tobacco Use  . Smoking status: Current Every Day Smoker    Types: Cigarettes  . Smokeless tobacco: Never Used  . Tobacco comment: 7 ciggs per day   Substance and Sexual Activity  . Alcohol use: Yes    Comment: Occasional on special occasions  . Drug use: No  . Sexual activity: Not on file  Other Topics Concern  . Not on file  Social History Narrative  . Not on file   Social Determinants of Health   Financial Resource Strain:   . Difficulty of Paying Living Expenses:   Food Insecurity:   . Worried About Charity fundraiser in the Last Year:   . Arboriculturist in the Last Year:   Transportation Needs:   . Film/video editor (Medical):   Marland Kitchen Lack of Transportation (Non-Medical):   Physical Activity:   . Days of Exercise per Week:   . Minutes of Exercise per Session:   Stress:   . Feeling of Stress :   Social Connections:   . Frequency of Communication with Friends and Family:   . Frequency of Social Gatherings with Friends and Family:   .  Attends Religious Services:   . Active Member of Clubs or Organizations:   . Attends Archivist Meetings:   Marland Kitchen Marital Status:   Intimate Partner Violence:   . Fear of Current or Ex-Partner:   . Emotionally Abused:   Marland Kitchen Physically Abused:   . Sexually Abused:         Objective:    BP 106/76   Pulse 86   Temp 97.7 F (36.5 C) (Temporal)   Ht 5' 5"  (1.651 m)   Wt 212 lb (96.2 kg)   SpO2 100%   BMI 35.28 kg/m   Wt Readings  from Last 3 Encounters:  09/15/19 212 lb (96.2 kg)  03/28/19 207 lb 3.2 oz (94 kg)  02/28/19 201 lb (91.2 kg)    Physical Exam Vitals reviewed.  Constitutional:      General: She is not in acute distress.    Appearance: Normal appearance. She is obese. She is not ill-appearing, toxic-appearing or diaphoretic.  HENT:     Head: Normocephalic and atraumatic.  Eyes:     General: No scleral icterus.       Right eye: No discharge.        Left eye: No discharge.     Conjunctiva/sclera: Conjunctivae normal.  Cardiovascular:     Rate and Rhythm: Normal rate and regular rhythm.     Heart sounds: Normal heart sounds. No murmur. No friction rub. No gallop.   Pulmonary:     Effort: Pulmonary effort is normal. No respiratory distress.     Breath sounds: Normal breath sounds. No stridor. No wheezing, rhonchi or rales.  Musculoskeletal:        General: Normal range of motion.     Cervical back: Normal range of motion.  Skin:    General: Skin is warm and dry.     Capillary Refill: Capillary refill takes less than 2 seconds.     Findings: Rash (flat with dark border and clear inside) present.  Neurological:     General: No focal deficit present.     Mental Status: She is alert and oriented to person, place, and time. Mental status is at baseline.  Psychiatric:        Mood and Affect: Mood normal.        Behavior: Behavior normal.        Thought Content: Thought content normal.        Judgment: Judgment normal.     Lab Results  Component Value  Date   TSH 0.945 09/15/2019   Lab Results  Component Value Date   WBC 8.3 09/15/2019   HGB 13.8 09/15/2019   HCT 42.2 09/15/2019   MCV 80 09/15/2019   PLT 314 09/15/2019   Lab Results  Component Value Date   NA 141 09/15/2019   K 4.1 09/15/2019   CO2 23 09/15/2019   GLUCOSE 91 09/15/2019   BUN 15 09/15/2019   CREATININE 1.01 (H) 09/15/2019   BILITOT 0.5 09/15/2019   ALKPHOS 82 09/15/2019   AST 25 09/15/2019   ALT 19 09/15/2019   PROT 7.6 09/15/2019   ALBUMIN 4.3 09/15/2019   CALCIUM 9.8 09/15/2019   ANIONGAP 7 01/29/2015   Lab Results  Component Value Date   CHOL 191 09/15/2019   Lab Results  Component Value Date   HDL 47 09/15/2019   Lab Results  Component Value Date   LDLCALC 130 (H) 09/15/2019   Lab Results  Component Value Date   TRIG 75 09/15/2019   Lab Results  Component Value Date   CHOLHDL 4.1 09/15/2019   No results found for: HGBA1C

## 2019-09-16 LAB — CBC WITH DIFFERENTIAL/PLATELET
Basophils Absolute: 0 10*3/uL (ref 0.0–0.2)
Basos: 1 %
EOS (ABSOLUTE): 0.1 10*3/uL (ref 0.0–0.4)
Eos: 1 %
Hematocrit: 42.2 % (ref 34.0–46.6)
Hemoglobin: 13.8 g/dL (ref 11.1–15.9)
Immature Grans (Abs): 0 10*3/uL (ref 0.0–0.1)
Immature Granulocytes: 0 %
Lymphocytes Absolute: 2.5 10*3/uL (ref 0.7–3.1)
Lymphs: 30 %
MCH: 26.2 pg — ABNORMAL LOW (ref 26.6–33.0)
MCHC: 32.7 g/dL (ref 31.5–35.7)
MCV: 80 fL (ref 79–97)
Monocytes Absolute: 0.4 10*3/uL (ref 0.1–0.9)
Monocytes: 5 %
Neutrophils Absolute: 5.2 10*3/uL (ref 1.4–7.0)
Neutrophils: 63 %
Platelets: 314 10*3/uL (ref 150–450)
RBC: 5.26 x10E6/uL (ref 3.77–5.28)
RDW: 14 % (ref 11.7–15.4)
WBC: 8.3 10*3/uL (ref 3.4–10.8)

## 2019-09-16 LAB — CMP14+EGFR
ALT: 19 IU/L (ref 0–32)
AST: 25 IU/L (ref 0–40)
Albumin/Globulin Ratio: 1.3 (ref 1.2–2.2)
Albumin: 4.3 g/dL (ref 3.8–4.9)
Alkaline Phosphatase: 82 IU/L (ref 48–121)
BUN/Creatinine Ratio: 15 (ref 9–23)
BUN: 15 mg/dL (ref 6–24)
Bilirubin Total: 0.5 mg/dL (ref 0.0–1.2)
CO2: 23 mmol/L (ref 20–29)
Calcium: 9.8 mg/dL (ref 8.7–10.2)
Chloride: 101 mmol/L (ref 96–106)
Creatinine, Ser: 1.01 mg/dL — ABNORMAL HIGH (ref 0.57–1.00)
GFR calc Af Amer: 71 mL/min/{1.73_m2} (ref >59)
GFR calc non Af Amer: 62 mL/min/{1.73_m2} (ref >59)
Globulin, Total: 3.3 g/dL (ref 1.5–4.5)
Glucose: 91 mg/dL (ref 65–99)
Potassium: 4.1 mmol/L (ref 3.5–5.2)
Sodium: 141 mmol/L (ref 134–144)
Total Protein: 7.6 g/dL (ref 6.0–8.5)

## 2019-09-16 LAB — LIPID PANEL
Chol/HDL Ratio: 4.1 ratio (ref 0.0–4.4)
Cholesterol, Total: 191 mg/dL (ref 100–199)
HDL: 47 mg/dL (ref >39)
LDL Chol Calc (NIH): 130 mg/dL — ABNORMAL HIGH (ref 0–99)
Triglycerides: 75 mg/dL (ref 0–149)
VLDL Cholesterol Cal: 14 mg/dL (ref 5–40)

## 2019-09-16 LAB — TSH: TSH: 0.945 u[IU]/mL (ref 0.450–4.500)

## 2019-09-16 LAB — VITAMIN D 25 HYDROXY (VIT D DEFICIENCY, FRACTURES): Vit D, 25-Hydroxy: 41.4 ng/mL (ref 30.0–100.0)

## 2019-09-18 ENCOUNTER — Encounter: Payer: Self-pay | Admitting: Family Medicine

## 2019-10-17 ENCOUNTER — Encounter: Payer: Self-pay | Admitting: Family Medicine

## 2019-10-17 ENCOUNTER — Ambulatory Visit (INDEPENDENT_AMBULATORY_CARE_PROVIDER_SITE_OTHER): Payer: 59 | Admitting: Family Medicine

## 2019-10-17 ENCOUNTER — Other Ambulatory Visit: Payer: Self-pay

## 2019-10-17 VITALS — BP 113/81 | HR 82 | Temp 97.5°F | Ht 65.0 in | Wt 206.4 lb

## 2019-10-17 DIAGNOSIS — E669 Obesity, unspecified: Secondary | ICD-10-CM

## 2019-10-17 DIAGNOSIS — R21 Rash and other nonspecific skin eruption: Secondary | ICD-10-CM

## 2019-10-17 MED ORDER — MECLIZINE HCL 25 MG PO TABS: 25.0000 mg | ORAL_TABLET | Freq: Every day | ORAL | 2 refills | Status: DC | PRN

## 2019-10-17 MED ORDER — PHENTERMINE HCL 37.5 MG PO TABS: 37.5000 mg | ORAL_TABLET | Freq: Every day | ORAL | 0 refills | Status: AC

## 2019-10-17 MED ORDER — TRIAMCINOLONE ACETONIDE 0.1 % EX CREA: 1.0000 "application " | TOPICAL_CREAM | Freq: Two times a day (BID) | CUTANEOUS | 1 refills | Status: DC

## 2019-10-17 MED ORDER — LOSARTAN POTASSIUM-HCTZ 100-25 MG PO TABS: 1.0000 | ORAL_TABLET | Freq: Every day | ORAL | 1 refills | Status: AC

## 2019-10-17 NOTE — Progress Notes (Signed)
Assessment & Plan:  1. Obesity (BMI 35.0-39.9 without comorbidity) - Patient is losing weight. Continue Adipex, diet, and exercise. Patient is going to try the keto diet for the next month and we will repeat her lipid panel at her next visit.  - phentermine (ADIPEX-P) 37.5 MG tablet; Take 1 tablet (37.5 mg total) by mouth daily before breakfast.  Dispense: 30 tablet; Refill: 0  2. Rash - Switching patient from fungal cream to steroid cream since there was no improvement.  - triamcinolone cream (KENALOG) 0.1 %; Apply 1 application topically 2 (two) times daily.  Dispense: 80 g; Refill: 1   Return in about 4 weeks (around 11/14/2019) for weight .  Hendricks Limes, MSN, APRN, FNP-C [facility]  Subjective:    Patient ID: Rhonda Merritt, female    DOB: 1960/09/11, 59 y.o.   MRN: [id number]  Patient Care Team: Loman Brooklyn, FNP as PCP - General (Family Medicine) Danie Binder, MD (Inactive) as Consulting Physician (Gastroenterology)   Chief Complaint:  Chief Complaint  Patient presents with  . Weight Check    4 week re check     HPI: Rhonda Merritt is a 59 y.o. female presenting on 10/17/2019 for Weight Check (4 week re check )  Patient is here for a follow-up of her weight. She was started back on Adipex a month ago. She has lost 6 lbs. She is slightly discouraged that she hasn't lost more weight. She states when she took Adipex last year she was also doing the keto diet which she has been afraid to try this year due to her high cholesterol. She is eating small portions and trying to eat healthy while also eating appropriately for her elevated LDL level.   New complaints: She is also concerned that the rash under her left arm has not improved. She has been applying ketoconazole cream daily.   Social history:  Relevant past medical, surgical, family and social history reviewed and updated as indicated. Interim medical history since our last visit  reviewed.  Allergies and medications reviewed and updated.  DATA REVIEWED: CHART IN EPIC  ROS: Negative unless specifically indicated above in HPI.    Current Outpatient Medications:  .  albuterol (VENTOLIN HFA) 108 (90 Base) MCG/ACT inhaler, Inhale 1-2 puffs into the lungs every 6 (six) hours as needed for shortness of breath or wheezing., Disp: , Rfl:  .  Black Cohosh 40 MG CAPS, Take 80 mg by mouth daily. , Disp: , Rfl:  .  ergocalciferol (VITAMIN D2) 1.25 MG (50000 UT) capsule, Take 1 capsule (50,000 Units total) by mouth once a week. (Patient taking differently: Take 50,000 Units by mouth 2 (two) times a week. ), Disp: 12 capsule, Rfl: 1 .  Glycerin-Hypromellose-PEG 400 ([employer] DRY EYE RELIEF) 0.2-0.2-1 % SOLN, Place 1 drop into both eyes daily., Disp: , Rfl:  .  ketoconazole (NIZORAL) 2 % cream, Apply 1 application topically daily., Disp: 60 g, Rfl: 0 .  losartan-hydrochlorothiazide (HYZAAR) 100-25 MG tablet, Take 1 tablet by mouth daily., Disp: 90 tablet, Rfl: 1 .  meclizine (ANTIVERT) 25 MG tablet, Take 25 mg by mouth daily as needed for dizziness. , Disp: , Rfl:  .  phentermine (ADIPEX-P) 37.5 MG tablet, Take 1 tablet (37.5 mg total) by mouth daily before breakfast., Disp: 30 tablet, Rfl: 0 .  Pumpkin Seed-Soy Germ (AZO BLADDER CONTROL/GO-LESS PO), Take 1 tablet by mouth daily. , Disp: , Rfl:  .  sodium chloride (AYR) 0.65 % nasal  spray, Place 1 spray into the nose daily., Disp: , Rfl:    Allergies  Allergen Reactions  . Asa [Aspirin] Hives  . Cefadroxil Hives  . Contrast Media [Iodinated Diagnostic Agents] Hives  . Imitrex [Sumatriptan] Palpitations and Other (See Comments)    Flushing   Past Medical History:  Diagnosis Date  . Chest wall pain   . Chronic fatigue   . Elevated blood sugar   . Essential hypertension   . History of colonic polyps   . Hyperlipidemia   . OAB (overactive bladder)   . Osteoarthritis   . Vertigo   . Vitamin D deficiency     Past Surgical  History:  Procedure Laterality Date  . ABDOMINAL HYSTERECTOMY    . CHOLECYSTECTOMY    . COLONOSCOPY  2015  . COLONOSCOPY N/A 08/04/2019   Procedure: COLONOSCOPY;  Surgeon: Daneil Dolin, MD;  [facility];  Service: Endoscopy;  Laterality: N/A;  9:30  . POLYPECTOMY  08/04/2019   Procedure: POLYPECTOMY;  Surgeon: Daneil Dolin, MD;  Location: [facility];  Service: Endoscopy;;    Social History   Socioeconomic History  . Marital status: Married    Spouse name: Not on file  . Number of children: Not on file  . Years of education: Not on file  . Highest education level: Not on file  Occupational History  . Not on file  Tobacco Use  . Smoking status: Current Every Day Smoker    Types: Cigarettes  . Smokeless tobacco: Never Used  . Tobacco comment: 7 ciggs per day   Substance and Sexual Activity  . Alcohol use: Yes    Comment: Occasional on special occasions  . Drug use: No  . Sexual activity: Not on file  Other Topics Concern  . Not on file  Social History Narrative  . Not on file   Social Determinants of Health   Financial Resource Strain:   . Difficulty of Paying Living Expenses:   Food Insecurity:   . Worried About Charity fundraiser in the Last Year:   . Arboriculturist in the Last Year:   Transportation Needs:   . Film/video editor (Medical):   Marland Kitchen Lack of Transportation (Non-Medical):   Physical Activity:   . Days of Exercise per Week:   . Minutes of Exercise per Session:   Stress:   . Feeling of Stress :   Social Connections:   . Frequency of Communication with Friends and Family:   . Frequency of Social Gatherings with Friends and Family:   . Attends Religious Services:   . Active Member of Clubs or Organizations:   . Attends Archivist Meetings:   Marland Kitchen Marital Status:   Intimate Partner Violence:   . Fear of Current or Ex-Partner:   . Emotionally Abused:   Marland Kitchen Physically Abused:   . Sexually Abused:         Objective:    BP  113/81   Pulse 82   Temp (!) 97.5 F (36.4 C) (Temporal)   Ht 5\' 5"  (1.651 m)   Wt 206 lb 6.4 oz (93.6 kg)   BMI 34.35 kg/m   Wt Readings from Last 3 Encounters:  10/17/19 206 lb 6.4 oz (93.6 kg)  09/15/19 212 lb (96.2 kg)  03/28/19 207 lb 3.2 oz (94 kg)    Physical Exam Vitals reviewed.  Constitutional:      General: She is not in acute distress.    Appearance:  Normal appearance. She is obese. She is not ill-appearing, toxic-appearing or diaphoretic.  HENT:     Head: Normocephalic and atraumatic.  Eyes:     General: No scleral icterus.       Right eye: No discharge.        Left eye: No discharge.     Conjunctiva/sclera: Conjunctivae normal.  Cardiovascular:     Rate and Rhythm: Normal rate and regular rhythm.     Heart sounds: Normal heart sounds. No murmur heard.  No friction rub. No gallop.   Pulmonary:     Effort: Pulmonary effort is normal. No respiratory distress.     Breath sounds: Normal breath sounds. No stridor. No wheezing, rhonchi or rales.  Musculoskeletal:        General: Normal range of motion.     Cervical back: Normal range of motion.  Skin:    General: Skin is warm and dry.     Capillary Refill: Capillary refill takes less than 2 seconds.  Neurological:     General: No focal deficit present.     Mental Status: She is alert and oriented to person, place, and time. Mental status is at baseline.  Psychiatric:        Mood and Affect: Mood normal.        Behavior: Behavior normal.        Thought Content: Thought content normal.        Judgment: Judgment normal.     Lab Results  Component Value Date   TSH 0.945 09/15/2019   Lab Results  Component Value Date   WBC 8.3 09/15/2019   HGB 13.8 09/15/2019   HCT 42.2 09/15/2019   MCV 80 09/15/2019   PLT 314 09/15/2019   Lab Results  Component Value Date   NA 141 09/15/2019   K 4.1 09/15/2019   CO2 23 09/15/2019   GLUCOSE 91 09/15/2019   BUN 15 09/15/2019   CREATININE 1.01 (H) 09/15/2019    BILITOT 0.5 09/15/2019   ALKPHOS 82 09/15/2019   AST 25 09/15/2019   ALT 19 09/15/2019   PROT 7.6 09/15/2019   ALBUMIN 4.3 09/15/2019   CALCIUM 9.8 09/15/2019   ANIONGAP 7 01/29/2015   Lab Results  Component Value Date   CHOL 191 09/15/2019   Lab Results  Component Value Date   HDL 47 09/15/2019   Lab Results  Component Value Date   LDLCALC 130 (H) 09/15/2019   Lab Results  Component Value Date   TRIG 75 09/15/2019   Lab Results  Component Value Date   CHOLHDL 4.1 09/15/2019   No results found for: HGBA1C

## 2019-11-16 ENCOUNTER — Ambulatory Visit: Payer: 59 | Admitting: Family Medicine

## 2019-11-30 ENCOUNTER — Ambulatory Visit: Payer: 59 | Admitting: Family Medicine

## 2019-12-04 ENCOUNTER — Encounter: Payer: Self-pay | Admitting: Family Medicine

## 2020-02-27 ENCOUNTER — Ambulatory Visit
Admission: RE | Admit: 2020-02-27 | Discharge: 2020-02-27 | Disposition: A | Discharge status: Home / Self Care | Payer: 59 | Source: Ambulatory Visit | Attending: Family Medicine | Admitting: Family Medicine

## 2020-02-27 ENCOUNTER — Other Ambulatory Visit: Payer: Self-pay | Admitting: Family Medicine

## 2020-02-27 ENCOUNTER — Other Ambulatory Visit: Payer: Self-pay

## 2020-02-27 DIAGNOSIS — N6310 Unspecified lump in the right breast, unspecified quadrant: Secondary | ICD-10-CM

## 2020-05-14 ENCOUNTER — Other Ambulatory Visit: Payer: 59

## 2021-02-20 ENCOUNTER — Emergency Department (HOSPITAL_COMMUNITY)
Admission: EM | Admit: 2021-02-20 | Discharge: 2021-02-20 | Disposition: A | Discharge status: Home / Self Care | Payer: 59 | Attending: Emergency Medicine | Admitting: Emergency Medicine

## 2021-02-20 ENCOUNTER — Other Ambulatory Visit: Payer: Self-pay

## 2021-02-20 ENCOUNTER — Encounter (HOSPITAL_COMMUNITY): Payer: Self-pay | Admitting: *Deleted

## 2021-02-20 DIAGNOSIS — N3001 Acute cystitis with hematuria: Secondary | ICD-10-CM | POA: Insufficient documentation

## 2021-02-20 DIAGNOSIS — Z79899 Other long term (current) drug therapy: Secondary | ICD-10-CM | POA: Diagnosis not present

## 2021-02-20 DIAGNOSIS — F17210 Nicotine dependence, cigarettes, uncomplicated: Secondary | ICD-10-CM | POA: Diagnosis not present

## 2021-02-20 DIAGNOSIS — D72829 Elevated white blood cell count, unspecified: Secondary | ICD-10-CM | POA: Diagnosis not present

## 2021-02-20 DIAGNOSIS — R319 Hematuria, unspecified: Secondary | ICD-10-CM | POA: Diagnosis present

## 2021-02-20 DIAGNOSIS — I10 Essential (primary) hypertension: Secondary | ICD-10-CM | POA: Diagnosis not present

## 2021-02-20 LAB — COMPREHENSIVE METABOLIC PANEL
ALT: 20 U/L (ref 0–44)
AST: 22 U/L (ref 15–41)
Albumin: 3.8 g/dL (ref 3.5–5.0)
Alkaline Phosphatase: 84 U/L (ref 38–126)
Anion gap: 10 (ref 5–15)
BUN: 20 mg/dL (ref 6–20)
CO2: 27 mmol/L (ref 22–32)
Calcium: 9.6 mg/dL (ref 8.9–10.3)
Chloride: 100 mmol/L (ref 98–111)
Creatinine, Ser: 1.27 mg/dL — ABNORMAL HIGH (ref 0.44–1.00)
GFR, Estimated: 49 mL/min — ABNORMAL LOW (ref >60)
Glucose, Bld: 103 mg/dL — ABNORMAL HIGH (ref 70–99)
Potassium: 3.4 mmol/L — ABNORMAL LOW (ref 3.5–5.1)
Sodium: 137 mmol/L (ref 135–145)
Total Bilirubin: 0.5 mg/dL (ref 0.3–1.2)
Total Protein: 8.5 g/dL — ABNORMAL HIGH (ref 6.5–8.1)

## 2021-02-20 LAB — URINALYSIS, ROUTINE W REFLEX MICROSCOPIC
Bilirubin Urine: NEGATIVE
Glucose, UA: NEGATIVE mg/dL
Ketones, ur: NEGATIVE mg/dL
Nitrite: POSITIVE — AB
Protein, ur: 30 mg/dL — AB
RBC / HPF: >50 RBC/hpf — ABNORMAL HIGH (ref 0–5)
Specific Gravity, Urine: 1.02 (ref 1.005–1.030)
pH: 6 (ref 5.0–8.0)

## 2021-02-20 LAB — CBC
HCT: 42.4 % (ref 36.0–46.0)
Hemoglobin: 13.2 g/dL (ref 12.0–15.0)
MCH: 26.2 pg (ref 26.0–34.0)
MCHC: 31.1 g/dL (ref 30.0–36.0)
MCV: 84.1 fL (ref 80.0–100.0)
Platelets: 341 10*3/uL (ref 150–400)
RBC: 5.04 MIL/uL (ref 3.87–5.11)
RDW: 16.1 % — ABNORMAL HIGH (ref 11.5–15.5)
WBC: 11.4 10*3/uL — ABNORMAL HIGH (ref 4.0–10.5)
nRBC: 0 % (ref 0.0–0.2)

## 2021-02-20 MED ORDER — SULFAMETHOXAZOLE-TRIMETHOPRIM 800-160 MG PO TABS: 1.0000 | ORAL_TABLET | Freq: Two times a day (BID) | ORAL | 0 refills | Status: AC

## 2021-02-20 NOTE — ED Triage Notes (Signed)
Blood in urine

## 2021-02-20 NOTE — ED Provider Notes (Signed)
[facility] EMERGENCY DEPARTMENT Provider Note   CSN: [id number] Arrival date & time: 02/20/21  1610     History Chief Complaint  Patient presents with   Hematuria    Rhonda Merritt is a 60 y.o. female with history of overactive bladder resents the emergency department for evaluation of hematuria today.  Patient reports her urine was dark and slightly pink-colored.  She has been having some lower suprapubic abdominal pain that is been intermittent and crampy in nature.  3 out of 10 pain.  Patient reports she has had some nausea over the past few days.  Denies any blood thinner use.  Patient has a medical history of hypertension in which she takes losartan for.  Denies any back pain or flank pain.  Denies any dysuria.  Because of the patient's overactive bladder she has urgency and frequency at baseline, but reports it has not worsened any.  She denies any fever, chest pain, shortness of breath, dark or tarry stools, constipation, diarrhea.  Patient's daily smoker, denies any EtOH or illicit drug use.   Hematuria Associated symptoms include abdominal pain. Pertinent negatives include no chest pain and no shortness of breath.      Past Medical History:  Diagnosis Date   Chest wall pain    Chronic fatigue    Elevated blood sugar    Essential hypertension    History of colonic polyps    Hyperlipidemia    OAB (overactive bladder)    Osteoarthritis    Vertigo    Vitamin D deficiency     Patient Active Problem List   Diagnosis Date Noted   Tobacco use 02/28/2019   Essential hypertension    Hyperlipidemia    OAB (overactive bladder)    Vertigo    Vitamin D deficiency    History of colonic polyps    Elevated blood sugar    Chronic fatigue    Chest wall pain     Past Surgical History:  Procedure Laterality Date   ABDOMINAL HYSTERECTOMY     CHOLECYSTECTOMY     COLONOSCOPY  2015   COLONOSCOPY N/A 08/04/2019   Procedure: COLONOSCOPY;  Surgeon: Daneil Dolin, MD;   Location: [facility];  Service: Endoscopy;  Laterality: N/A;  9:30   POLYPECTOMY  08/04/2019   Procedure: POLYPECTOMY;  Surgeon: Daneil Dolin, MD;  Location: [facility];  Service: Endoscopy;;     OB History   No obstetric history on file.     Family History  Problem Relation Age of Onset   Heart disease Father    Hypertension Father    Diabetes Mellitus II Father    Ovarian cancer Sister    Diabetes Mellitus II Maternal Grandmother    Breast cancer Sister    Ovarian cancer Sister    Multiple sclerosis Mother    Arrhythmia Daughter     Social History   Tobacco Use   Smoking status: Every Day    Types: Cigarettes   Smokeless tobacco: Never   Tobacco comments:    7 ciggs per day   Substance Use Topics   Alcohol use: Yes    Comment: Occasional on special occasions   Drug use: No    Home Medications Prior to Admission medications   Medication Sig Start Date End Date Taking? Authorizing Provider  sulfamethoxazole-trimethoprim (BACTRIM DS) 800-160 MG tablet Take 1 tablet by mouth 2 (two) times daily for 10 days. 02/20/21 03/02/21 Yes Sherrell Puller, PA-C  albuterol (VENTOLIN HFA) 108 (90  Base) MCG/ACT inhaler Inhale 1-2 puffs into the lungs every 6 (six) hours as needed for shortness of breath or wheezing.    [provider]  Black Cohosh 40 MG CAPS Take 80 mg by mouth daily.     [provider]  ergocalciferol (VITAMIN D2) 1.25 MG (50000 UT) capsule Take 1 capsule (50,000 Units total) by mouth once a week. Patient taking differently: Take 50,000 Units by mouth 2 (two) times a week.  02/28/19   Loman Brooklyn, FNP  Glycerin-Hypromellose-PEG 400 ([employer] DRY EYE RELIEF) 0.2-0.2-1 % SOLN Place 1 drop into both eyes daily.    [provider]  losartan-hydrochlorothiazide (HYZAAR) 100-25 MG tablet Take 1 tablet by mouth daily. 10/17/19   Loman Brooklyn, FNP  meclizine (ANTIVERT) 25 MG tablet Take 1 tablet (25 mg total) by mouth daily as needed for  dizziness. 10/17/19   Loman Brooklyn, FNP  phentermine (ADIPEX-P) 37.5 MG tablet Take 1 tablet (37.5 mg total) by mouth daily before breakfast. 10/17/19   Loman Brooklyn, FNP  Pumpkin Seed-Soy Germ (AZO BLADDER CONTROL/GO-LESS PO) Take 1 tablet by mouth daily.     [provider]  sodium chloride (AYR) 0.65 % nasal spray Place 1 spray into the nose daily.    [provider]  triamcinolone cream (KENALOG) 0.1 % Apply 1 application topically 2 (two) times daily. 10/17/19   Loman Brooklyn, FNP    Allergies    Asa [aspirin], Cefadroxil, Contrast media [iodinated diagnostic agents], and Imitrex [sumatriptan]  Review of Systems   Review of Systems  Constitutional:  Negative for chills and fever.  HENT:  Negative for ear pain and sore throat.   Eyes:  Negative for pain and visual disturbance.  Respiratory:  Negative for cough and shortness of breath.   Cardiovascular:  Negative for chest pain and palpitations.  Gastrointestinal:  Positive for abdominal pain and nausea. Negative for anal bleeding, blood in stool, constipation, diarrhea and vomiting.  Genitourinary:  Positive for frequency, hematuria and urgency. Negative for difficulty urinating, dysuria, flank pain, vaginal bleeding and vaginal discharge.  Musculoskeletal:  Negative for arthralgias and back pain.  Skin:  Negative for color change and rash.  Neurological:  Negative for seizures and syncope.  All other systems reviewed and are negative.  Physical Exam Updated Vital Signs BP 120/85 (BP Location: Left Arm)   Pulse 75   Temp 97.8 F (36.6 C) (Oral)   Resp 17   Ht 5\' 5"  (1.651 m)   Wt 95.7 kg   SpO2 100%   BMI 35.11 kg/m   Physical Exam Vitals and nursing note reviewed.  Constitutional:      Appearance: Normal appearance.  HENT:     Head: Normocephalic and atraumatic.  Eyes:     General: No scleral icterus. Cardiovascular:     Rate and Rhythm: Normal rate and regular rhythm.  Pulmonary:      Effort: Pulmonary effort is normal. No respiratory distress.     Breath sounds: Normal breath sounds.  Abdominal:     General: Abdomen is flat. Bowel sounds are normal.     Palpations: Abdomen is soft.     Tenderness: There is abdominal tenderness. There is no guarding or rebound.     Comments: Mild abdominal tenderness palpation suprapubically  Musculoskeletal:        General: No deformity.     Cervical back: Normal range of motion.  Skin:    General: Skin is warm and dry.  Neurological:  General: No focal deficit present.     Mental Status: She is alert. Mental status is at baseline.    ED Results / Procedures / Treatments   Labs (all labs ordered are listed, but only abnormal results are displayed) Labs Reviewed  CBC - Abnormal; Notable for the following components:      Result Value   WBC 11.4 (*)    RDW 16.1 (*)    All other components within normal limits  COMPREHENSIVE METABOLIC PANEL - Abnormal; Notable for the following components:   Potassium 3.4 (*)    Glucose, Bld 103 (*)    Creatinine, Ser 1.27 (*)    Total Protein 8.5 (*)    GFR, Estimated 49 (*)    All other components within normal limits  URINALYSIS, ROUTINE W REFLEX MICROSCOPIC - Abnormal; Notable for the following components:   APPearance CLOUDY (*)    Hgb urine dipstick MODERATE (*)    Protein, ur 30 (*)    Nitrite POSITIVE (*)    Leukocytes,Ua TRACE (*)    RBC / HPF >50 (*)    Bacteria, UA MANY (*)    All other components within normal limits  URINE CULTURE    EKG None  Radiology No results found.  Procedures Procedures   Medications Ordered in ED Medications - No data to display  ED Course  I have reviewed the triage vital signs and the nursing notes.  Pertinent labs & imaging results that were available during my care of the patient were reviewed by me and considered in my medical decision making (see chart for details).  This patient presents to the emergency department with  hematuria.  Differential diagnosis includes but is not limited to kidney stone, interstitial cystitis, bladder malignancy, UTI.  Exam, no CVA tenderness.  Patient is mildly tender to her suprapubic region.  No guarding or rebound.  VSS.  I personally reviewed and interpreted the patient's labs.  Analysis shows cloudy urine with moderate amount of blood.  Protein, nitrates, leukocytes seen.  Greater than 50 red blood cells.  11-12 white blood cells and many bacteria present.  CBC shows mild leukocytosis 11.4, but without anemia.  CMP shows mildly decreased potassium at 3.4, mildly increased glucose at 103.  Creatinine 1.27 recorded was over a year ago was 1.01.  Urine culture in process.  Patient's urinalysis consistent with UTI.  Given patient's slightly increased creatinine, will treat for pyelonephritis given Bactrim for the next 10 days.  Recommended the patient follow-up with her primary care provider for recheck of her creatinine and test of cure for her urine.  Return precautions given.  Patient agrees to plan.  Patient is stable be discharged home in good condition.  I discussed this case with my attending physician who cosigned this note including patient's presenting symptoms, physical exam, and planned diagnostics and interventions. Attending physician stated agreement with plan or made changes to plan which were implemented.   MDM Rules/Calculators/A&P                          Final Clinical Impression(s) / ED Diagnoses Final diagnoses:  Acute cystitis with hematuria    Rx / DC Orders ED Discharge Orders          Ordered    sulfamethoxazole-trimethoprim (BACTRIM DS) 800-160 MG tablet  2 times daily        02/20/21 1901             Leonard,  Armida Sans 02/20/21 1914    Lajean Saver, MD 02/22/21 [phone]

## 2021-02-20 NOTE — Discharge Instructions (Signed)
You are seen here today and diagnosed with a UTI.  You have been prescribed Bactrim to take for the next 10 days.  Please make sure to adhere to the antibiotic course and complete it.  It is important to stay well-hydrated, mainly with water.  Please follow-up with your PCP for your slightly elevated creatinine and to test of cure for your urine.  Additional information on UTIs included in this discharge report.  If you have any fevers, worsening abdominal pain, nausea, vomiting, blood in your urine, please return to the nearest emergency department.

## 2021-02-22 LAB — URINE CULTURE: Culture: 90000 — AB

## 2021-02-23 ENCOUNTER — Telehealth: Payer: Self-pay | Admitting: Emergency Medicine

## 2021-02-23 NOTE — Telephone Encounter (Signed)
Post ED Visit - Positive Culture Follow-up  Culture report reviewed by antimicrobial stewardship pharmacist: [place] Team []  Elenor Quinones, Pharm.D. []  Heide Guile, Pharm.D., BCPS AQ-ID []  Parks Neptune, Pharm.D., BCPS []  Alycia Rossetti, Pharm.D., BCPS []  [place], [place].D., BCPS, AAHIVP []  Legrand Como, Pharm.D., BCPS, AAHIVP []  Salome Arnt, PharmD, BCPS []  Johnnette Gourd, PharmD, BCPS []  Hughes Better, PharmD, BCPS [x]  Joetta Manners, PharmD []  Laqueta Linden, PharmD, BCPS []  Albertina Parr, PharmD  [place] Team []  Leodis Sias, PharmD []  Lindell Spar, PharmD []  Royetta Asal, PharmD []  Graylin Shiver, Rph []  Rema Fendt) Glennon Mac, PharmD []  Arlyn Dunning, PharmD []  Netta Cedars, PharmD []  Dia Sitter, PharmD []  Leone Haven, PharmD []  Gretta Arab, PharmD []  Theodis Shove, PharmD []  Peggyann Juba, PharmD []  Reuel Boom, PharmD   Positive urine culture Treated with Sulfamethoxazole-trimethoprim, organism sensitive to the same and no further patient follow-up is required at this time.  [name] 02/23/2021, 5:35 PM

## 2021-12-16 IMAGING — US A
1 series · 4 of 4 positions shown · non-contrast
Comparison: Previous exam(s).

CLINICAL DATA: 59-year-old female presenting for annual exam as
well as follow-up of a probably benign right breast mass that was
first evaluated in April 2019

EXAM:
DIGITAL DIAGNOSTIC BILATERAL MAMMOGRAM WITH CAD
ULTRASOUND RIGHT BREAST

[Series 1: a · 0.05mm/px · 4 of 4 slices shown]
[im 1/4]
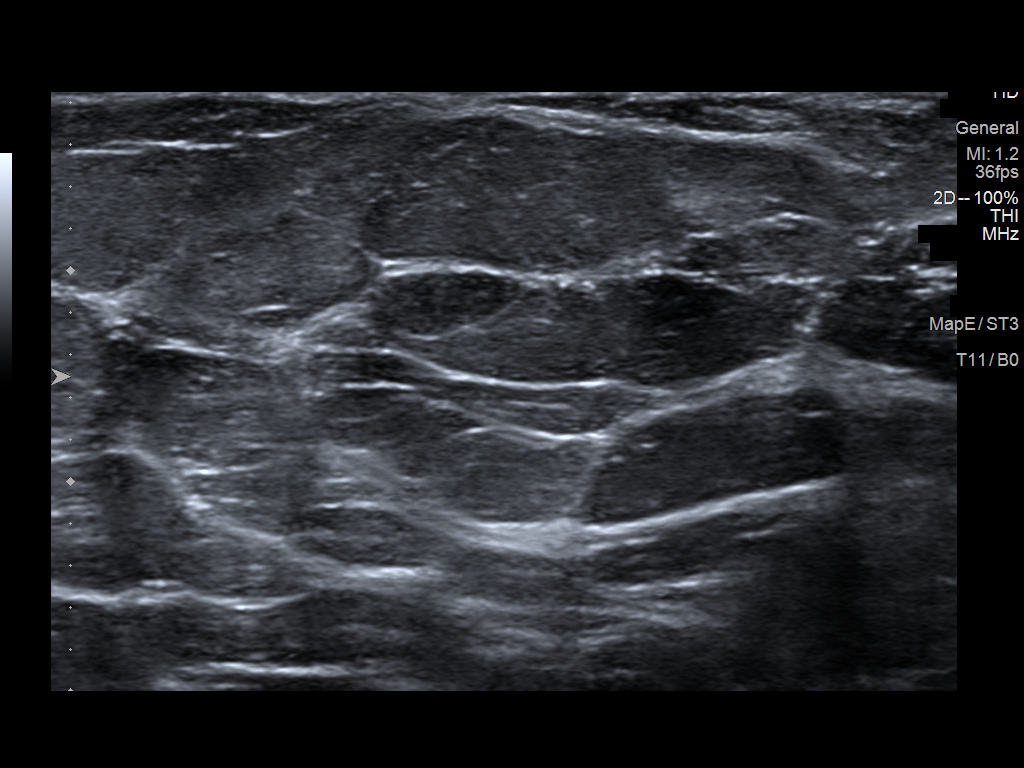
[im 2/4]
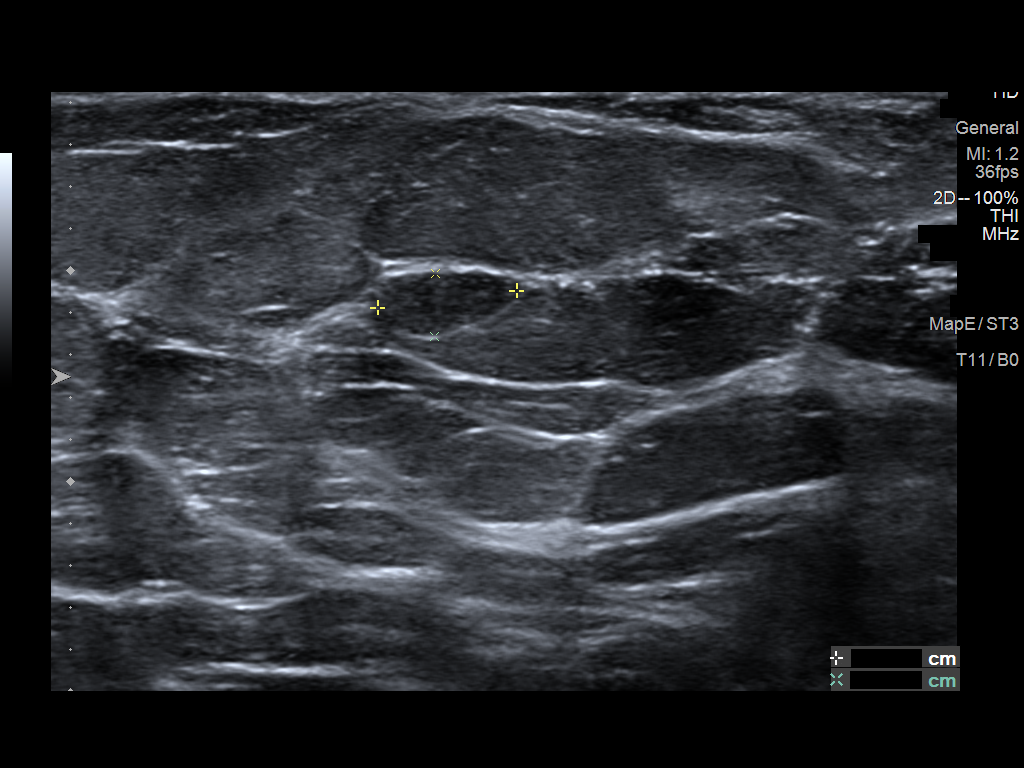
[im 3/4]
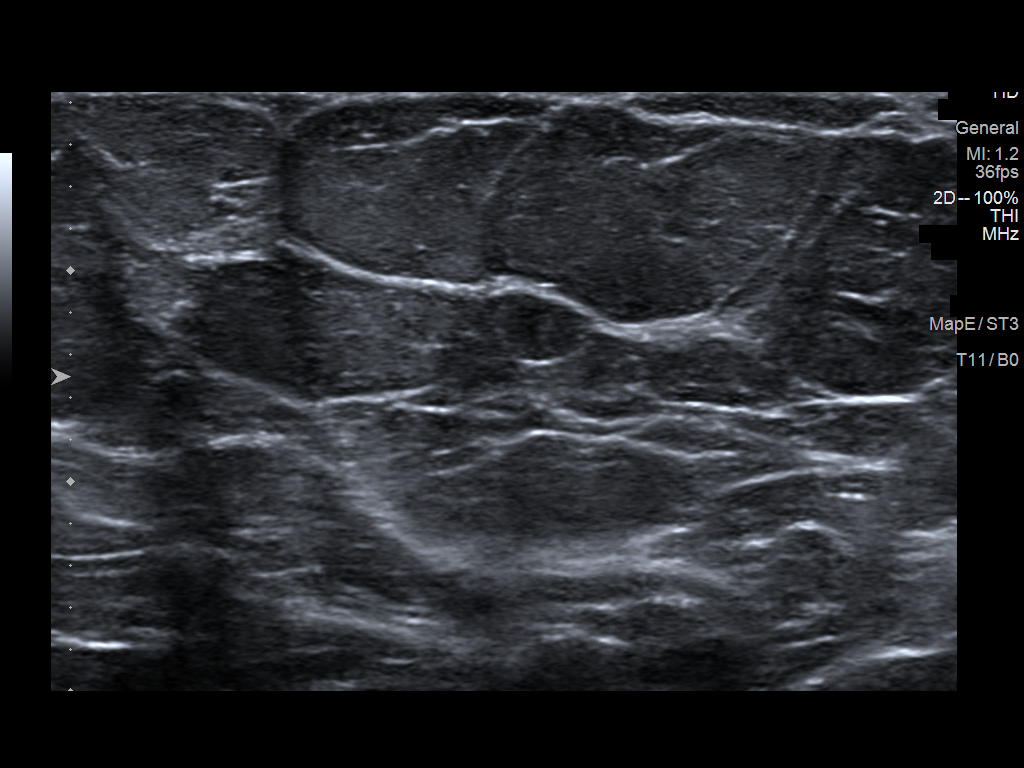
[im 4/4]
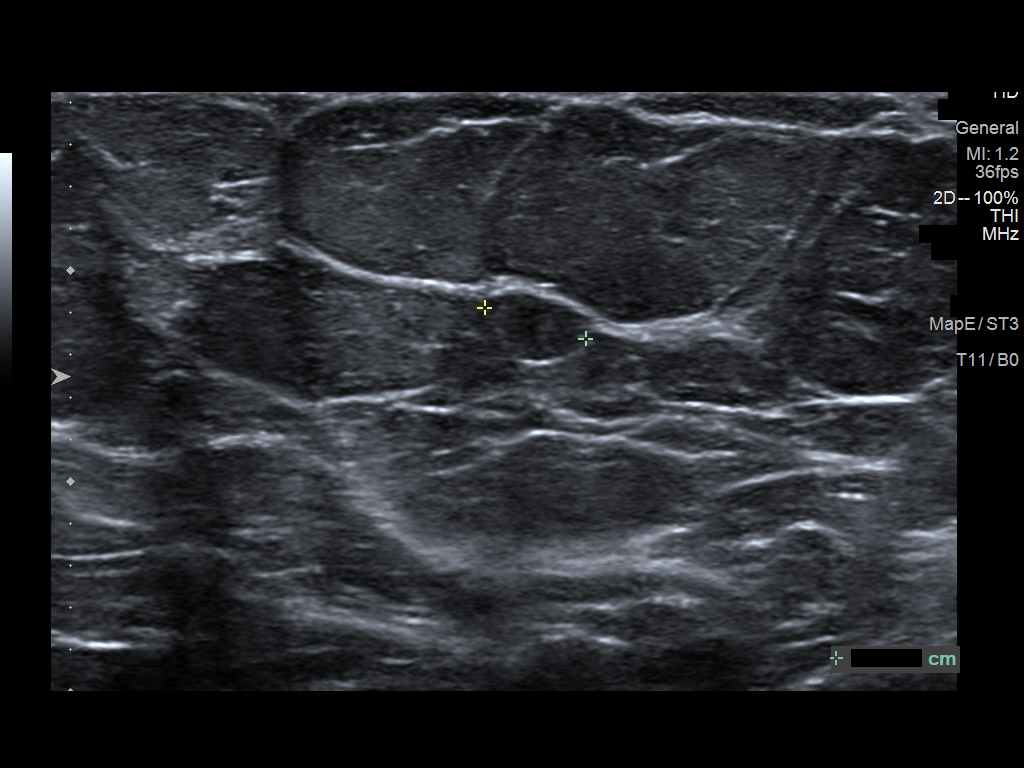

[4 of 4 positions shown; findings below may reference images not displayed]

ACR Breast Density Category b: There are scattered areas of
fibroglandular density.
FINDINGS: Mammogram:

Right breast: There is a stable circumscribed mass in the inferior
central right breast. No new suspicious mass, distortion, or
microcalcifications are identified to suggest presence of
malignancy.

Left breast: No suspicious mass, distortion, or microcalcifications
are identified to suggest presence of malignancy.

Mammographic images were processed with CAD.

Ultrasound:

Targeted ultrasound is performed in the right breast at 6 o'clock 2
cm from the nipple demonstrating an oval circumscribed isoechoic
mass measuring 0.7 x 0.3 x 0.5 cm, previously measuring 0.7 x 0.3 x
0.5 cm. This corresponds to the mass seen mammographically.
IMPRESSION: 1. Stable probably benign mass in the right breast at 6 o'clock
measuring 0.7 cm.

2.  No mammographic evidence of malignancy in the left breast.

RECOMMENDATION:
Right breast ultrasound in April 2020 to demonstrate 1 year
stability of the probably benign right breast mass.

I have discussed the findings and recommendations with the patient.
If applicable, a reminder letter will be sent to the patient
regarding the next appointment.

BI-RADS CATEGORY  3: Probably benign.

## 2022-05-31 ENCOUNTER — Emergency Department (HOSPITAL_COMMUNITY): Payer: 59

## 2022-05-31 ENCOUNTER — Emergency Department (HOSPITAL_COMMUNITY)
Admission: EM | Admit: 2022-05-31 | Discharge: 2022-05-31 | Disposition: A | Discharge status: Home / Self Care | Payer: 59 | Attending: Emergency Medicine | Admitting: Emergency Medicine

## 2022-05-31 ENCOUNTER — Other Ambulatory Visit: Payer: Self-pay

## 2022-05-31 ENCOUNTER — Encounter (HOSPITAL_COMMUNITY): Payer: Self-pay

## 2022-05-31 DIAGNOSIS — D72829 Elevated white blood cell count, unspecified: Secondary | ICD-10-CM | POA: Insufficient documentation

## 2022-05-31 DIAGNOSIS — N3001 Acute cystitis with hematuria: Secondary | ICD-10-CM | POA: Diagnosis not present

## 2022-05-31 DIAGNOSIS — N200 Calculus of kidney: Secondary | ICD-10-CM | POA: Diagnosis not present

## 2022-05-31 DIAGNOSIS — I700 Atherosclerosis of aorta: Secondary | ICD-10-CM | POA: Insufficient documentation

## 2022-05-31 DIAGNOSIS — R1010 Upper abdominal pain, unspecified: Secondary | ICD-10-CM

## 2022-05-31 LAB — COMPREHENSIVE METABOLIC PANEL
ALT: 21 U/L (ref 0–44)
AST: 28 U/L (ref 15–41)
Albumin: 3.6 g/dL (ref 3.5–5.0)
Alkaline Phosphatase: 82 U/L (ref 38–126)
Anion gap: 13 (ref 5–15)
BUN: 17 mg/dL (ref 6–20)
CO2: 27 mmol/L (ref 22–32)
Calcium: 9.5 mg/dL (ref 8.9–10.3)
Chloride: 96 mmol/L — ABNORMAL LOW (ref 98–111)
Creatinine, Ser: 1.06 mg/dL — ABNORMAL HIGH (ref 0.44–1.00)
GFR, Estimated: >60 mL/min (ref >60)
Glucose, Bld: 102 mg/dL — ABNORMAL HIGH (ref 70–99)
Potassium: 3.1 mmol/L — ABNORMAL LOW (ref 3.5–5.1)
Sodium: 136 mmol/L (ref 135–145)
Total Bilirubin: 0.7 mg/dL (ref 0.3–1.2)
Total Protein: 8.2 g/dL — ABNORMAL HIGH (ref 6.5–8.1)

## 2022-05-31 LAB — URINALYSIS, ROUTINE W REFLEX MICROSCOPIC
Bilirubin Urine: NEGATIVE
Glucose, UA: NEGATIVE mg/dL
Ketones, ur: 5 mg/dL — AB
Leukocytes,Ua: NEGATIVE
Nitrite: POSITIVE — AB
Protein, ur: NEGATIVE mg/dL
Specific Gravity, Urine: 1.02 (ref 1.005–1.030)
pH: 5 (ref 5.0–8.0)

## 2022-05-31 LAB — LIPASE, BLOOD: Lipase: 27 U/L (ref 11–51)

## 2022-05-31 LAB — CBC
HCT: 44.4 % (ref 36.0–46.0)
Hemoglobin: 14.3 g/dL (ref 12.0–15.0)
MCH: 26.6 pg (ref 26.0–34.0)
MCHC: 32.2 g/dL (ref 30.0–36.0)
MCV: 82.7 fL (ref 80.0–100.0)
Platelets: 321 10*3/uL (ref 150–400)
RBC: 5.37 MIL/uL — ABNORMAL HIGH (ref 3.87–5.11)
RDW: 15.4 % (ref 11.5–15.5)
WBC: 12 10*3/uL — ABNORMAL HIGH (ref 4.0–10.5)
nRBC: 0 % (ref 0.0–0.2)

## 2022-05-31 MED ORDER — SODIUM CHLORIDE 0.9 % IV BOLUS
500.0000 mL | Freq: Once | INTRAVENOUS | Status: AC
  Administered 2022-05-31: 500 mL via INTRAVENOUS

## 2022-05-31 MED ORDER — ONDANSETRON HCL 4 MG/2ML IJ SOLN
4.0000 mg | Freq: Once | INTRAMUSCULAR | Status: AC
  Administered 2022-05-31: 4 mg via INTRAVENOUS
  Filled 2022-05-31: qty 2

## 2022-05-31 MED ORDER — POTASSIUM CHLORIDE CRYS ER 20 MEQ PO TBCR: 40.0000 meq | EXTENDED_RELEASE_TABLET | Freq: Two times a day (BID) | ORAL | 0 refills | Status: DC

## 2022-05-31 MED ORDER — ONDANSETRON HCL 4 MG PO TABS: 4.0000 mg | ORAL_TABLET | Freq: Three times a day (TID) | ORAL | 0 refills | Status: DC | PRN

## 2022-05-31 MED ORDER — POTASSIUM CHLORIDE CRYS ER 20 MEQ PO TBCR
40.0000 meq | EXTENDED_RELEASE_TABLET | Freq: Once | ORAL | Status: AC
  Administered 2022-05-31: 40 meq via ORAL
  Filled 2022-05-31: qty 2

## 2022-05-31 MED ORDER — SULFAMETHOXAZOLE-TRIMETHOPRIM 800-160 MG PO TABS: 1.0000 | ORAL_TABLET | Freq: Two times a day (BID) | ORAL | 0 refills | Status: AC

## 2022-05-31 MED ORDER — SULFAMETHOXAZOLE-TRIMETHOPRIM 800-160 MG PO TABS
1.0000 | ORAL_TABLET | Freq: Once | ORAL | Status: AC
  Administered 2022-05-31: 1 via ORAL
  Filled 2022-05-31: qty 1

## 2022-05-31 MED ORDER — MORPHINE SULFATE (PF) 2 MG/ML IV SOLN
2.0000 mg | Freq: Once | INTRAVENOUS | Status: AC
  Administered 2022-05-31: 2 mg via INTRAVENOUS
  Filled 2022-05-31: qty 1

## 2022-05-31 MED ORDER — OXYCODONE-ACETAMINOPHEN 5-325 MG PO TABS: 1.0000 | ORAL_TABLET | Freq: Four times a day (QID) | ORAL | 0 refills | Status: DC | PRN

## 2022-05-31 NOTE — ED Triage Notes (Signed)
Pt reports sudden onset of upper abd pain since 1030 am.  Reports pains are intermittent but very strong, has nausea but denies any vomiting, diarrhea or constipation.

## 2022-05-31 NOTE — ED Notes (Signed)
Pt given crackers and water per PA's verbal order.

## 2022-05-31 NOTE — ED Provider Notes (Signed)
[place] EMERGENCY DEPARTMENT AT [facility] Provider Note   CSN: [id number] Arrival date & time: 05/31/22  1405     History  Chief Complaint  Patient presents with   Abdominal Pain    Rhonda Merritt is a 61 y.o. female who presents emergency department with concerns for sudden onset upper abdominal pain 10:30 AM this morning.  Still has her gallbladder and appendix.  Has associated nausea.  No meds tried prior to arrival.  Denies vomiting, diarrhea, constipation, chest pain, shortness of breath, fever, urinary symptoms.  Patient has had a cholecystectomy.  The history is provided by the patient. No language interpreter was used.       Home Medications Prior to Admission medications   Medication Sig Start Date End Date Taking? Authorizing Provider  ondansetron (ZOFRAN) 4 MG tablet Take 1 tablet (4 mg total) by mouth every 8 (eight) hours as needed for nausea or vomiting. 05/31/22  Yes Artina Minella A, PA-C  oxyCODONE-acetaminophen (PERCOCET/ROXICET) 5-325 MG tablet Take 1 tablet by mouth every 6 (six) hours as needed for severe pain. 05/31/22  Yes Khy Pitre A, PA-C  potassium chloride SA (KLOR-CON M) 20 MEQ tablet Take 2 tablets (40 mEq total) by mouth 2 (two) times daily for 5 days. 05/31/22 06/05/22 Yes Janila Arrazola A, PA-C  sulfamethoxazole-trimethoprim (BACTRIM DS) 800-160 MG tablet Take 1 tablet by mouth 2 (two) times daily for 5 days. 05/31/22 06/05/22 Yes Emberlynn Riggan A, PA-C  albuterol (VENTOLIN HFA) 108 (90 Base) MCG/ACT inhaler Inhale 1-2 puffs into the lungs every 6 (six) hours as needed for shortness of breath or wheezing.    [provider]  Black Cohosh 40 MG CAPS Take 80 mg by mouth daily.     [provider]  ergocalciferol (VITAMIN D2) 1.25 MG (50000 UT) capsule Take 1 capsule (50,000 Units total) by mouth once a week. Patient taking differently: Take 50,000 Units by mouth 2 (two) times a week.  02/28/19   Gwenlyn Fudge, FNP   Glycerin-Hypromellose-PEG 400 ([employer] DRY EYE RELIEF) 0.2-0.2-1 % SOLN Place 1 drop into both eyes daily.    [provider]  losartan-hydrochlorothiazide (HYZAAR) 100-25 MG tablet Take 1 tablet by mouth daily. 10/17/19   Gwenlyn Fudge, FNP  meclizine (ANTIVERT) 25 MG tablet Take 1 tablet (25 mg total) by mouth daily as needed for dizziness. 10/17/19   Gwenlyn Fudge, FNP  phentermine (ADIPEX-P) 37.5 MG tablet Take 1 tablet (37.5 mg total) by mouth daily before breakfast. 10/17/19   Gwenlyn Fudge, FNP  Pumpkin Seed-Soy Germ (AZO BLADDER CONTROL/GO-LESS PO) Take 1 tablet by mouth daily.     [provider]  sodium chloride (AYR) 0.65 % nasal spray Place 1 spray into the nose daily.    [provider]  triamcinolone cream (KENALOG) 0.1 % Apply 1 application topically 2 (two) times daily. 10/17/19   Gwenlyn Fudge, FNP      Allergies    Asa [aspirin], Cefadroxil, Contrast media [iodinated contrast media], and Imitrex [sumatriptan]    Review of Systems   Review of Systems  Gastrointestinal:  Positive for abdominal pain.  All other systems reviewed and are negative.   Physical Exam Updated Vital Signs BP 125/87 (BP Location: Right Arm)   Pulse 80   Temp (!) 97.5 F (36.4 C) (Oral)   Resp 18   Ht 5\' 5"  (1.651 m)   Wt 93 kg   SpO2 100%   BMI 34.11 kg/m  Physical  Exam Vitals and nursing note reviewed.  Constitutional:      General: She is not in acute distress.    Appearance: She is not diaphoretic.  HENT:     Head: Normocephalic and atraumatic.     Mouth/Throat:     Pharynx: No oropharyngeal exudate.  Eyes:     General: No scleral icterus.    Conjunctiva/sclera: Conjunctivae normal.  Cardiovascular:     Rate and Rhythm: Normal rate and regular rhythm.     Pulses: Normal pulses.     Heart sounds: Normal heart sounds.  Pulmonary:     Effort: Pulmonary effort is normal. No respiratory distress.     Breath sounds: Normal breath sounds. No wheezing.   Chest:     Chest wall: No tenderness.  Abdominal:     General: Bowel sounds are normal.     Palpations: Abdomen is soft. There is no mass.     Tenderness: There is abdominal tenderness. There is no guarding or rebound.     Comments: Upper abdominal TTP.   Musculoskeletal:        General: Normal range of motion.     Cervical back: Normal range of motion and neck supple.  Skin:    General: Skin is warm and dry.  Neurological:     Mental Status: She is alert.  Psychiatric:        Behavior: Behavior normal.     ED Results / Procedures / Treatments   Labs (all labs ordered are listed, but only abnormal results are displayed) Labs Reviewed  COMPREHENSIVE METABOLIC PANEL - Abnormal; Notable for the following components:      Result Value   Potassium 3.1 (*)    Chloride 96 (*)    Glucose, Bld 102 (*)    Creatinine, Ser 1.06 (*)    Total Protein 8.2 (*)    All other components within normal limits  CBC - Abnormal; Notable for the following components:   WBC 12.0 (*)    RBC 5.37 (*)    All other components within normal limits  URINALYSIS, ROUTINE W REFLEX MICROSCOPIC - Abnormal; Notable for the following components:   Hgb urine dipstick MODERATE (*)    Ketones, ur 5 (*)    Nitrite POSITIVE (*)    Bacteria, UA MANY (*)    All other components within normal limits  URINE CULTURE  LIPASE, BLOOD  POC URINE PREG, ED    EKG EKG Interpretation  Date/Time:  Sunday May 31 2022 14:16:47 EST Ventricular Rate:  82 PR Interval:  160 QRS Duration: 98 QT Interval:  398 QTC Calculation: 464 R Axis:   31 Text Interpretation: Sinus rhythm with occasional Premature ventricular complexes Confirmed by Gloris Manchester ([id number]) on 05/31/2022 3:14:28 PM  Radiology CT ABDOMEN PELVIS WO CONTRAST  Result Date: 05/31/2022 CLINICAL DATA:  Upper abdominal pain since this morning, nausea EXAM: CT ABDOMEN AND PELVIS WITHOUT CONTRAST TECHNIQUE: Multidetector CT imaging of the abdomen and pelvis was  performed following the standard protocol without IV contrast. Unenhanced CT was performed per clinician order. Lack of IV contrast limits sensitivity and specificity, especially for evaluation of abdominal/pelvic solid viscera. RADIATION DOSE REDUCTION: This exam was performed according to the departmental dose-optimization program which includes automated exposure control, adjustment of the mA and/or kV according to patient size and/or use of iterative reconstruction technique. COMPARISON:  None Available. FINDINGS: Lower chest: No acute pleural or parenchymal lung disease. Hepatobiliary: Cholecystectomy. Unremarkable unenhanced appearance of the liver. Pancreas: Unremarkable unenhanced appearance.  Spleen: Unremarkable unenhanced appearance. Adrenals/Urinary Tract: Faint nonobstructing calculi within the upper pole right kidney measure up to 3 mm in size. No left-sided calculi. No obstructive uropathy within either kidney. Right adrenal is unremarkable. Thickened medial limb of the left adrenal gland measuring 1.2 cm, with attenuation of 12 HU. This is most consistent with hyperplasia or adenoma. The bladder is unremarkable. Stomach/Bowel: No evidence of high-grade bowel obstruction. Mild nonspecific dilatation of the proximal and mid jejunum measuring up to 2.5 cm in diameter, with scattered gas fluid levels. This could reflect regional ileus or sequela of enteritis. Normal appendix right lower quadrant. No bowel wall thickening or inflammatory change. Vascular/Lymphatic: Aortic atherosclerosis. No enlarged abdominal or pelvic lymph nodes. Reproductive: Status post hysterectomy. No adnexal masses. Other: No free fluid or free intraperitoneal gas. No abdominal wall hernia. Musculoskeletal: No acute or destructive bony lesions. Reconstructed images demonstrate no additional findings. IMPRESSION: 1. Mild nonspecific dilation of the proximal to mid jejunum with scattered gas fluid levels. This could reflect ileus or  sequela of enteritis. No high-grade bowel obstruction. 2. Faint nonobstructing right renal calculi measuring up to 3 mm. No obstructive uropathy. 3. Normal appendix. 4.  Aortic Atherosclerosis (ICD10-I70.0). Electronically Signed   By: Sharlet Salina M.D.   On: 05/31/2022 16:08    Procedures Procedures    Medications Ordered in ED Medications  morphine (PF) 2 MG/ML injection 2 mg (2 mg Intravenous Given 05/31/22 1457)  ondansetron (ZOFRAN) injection 4 mg (4 mg Intravenous Given 05/31/22 1457)  sodium chloride 0.9 % bolus 500 mL (0 mLs Intravenous Stopped 05/31/22 1555)  potassium chloride SA (KLOR-CON M) CR tablet 40 mEq (40 mEq Oral Given 05/31/22 1639)  morphine (PF) 2 MG/ML injection 2 mg (2 mg Intravenous Given 05/31/22 1743)  sulfamethoxazole-trimethoprim (BACTRIM DS) 800-160 MG per tablet 1 tablet (1 tablet Oral Given 05/31/22 1857)    ED Course/ Medical Decision Making/ A&P Clinical Course as of 05/31/22 1949  Sun May 31, 2022  1559 Re-evaluated and resting comfortably on stretcher. Pt noted improvement of her symptoms with treatment regimen in the ED. Discussed with patient lab findings.  [SB]  1728 Discussed with patient regarding CT scan findings. Pt notes improvement of symptoms in the ED. Requesting additional pain med dose in the ED. Discussed with patient discharge treatment plan. Answered all available questions. Pt appears safe for discharge at this time.  [SB]  1839 Reevaluated and patient able to tolerate p.o. intake in the emergency department without nausea or vomiting.  Patient appears safe for discharge at this time. [SB]    Clinical Course User Index [SB] Amer Alcindor A, PA-C                             Medical Decision Making Amount and/or Complexity of Data Reviewed Labs: ordered. Radiology: ordered.  Risk Prescription drug management.   Patient presents to the emergency department with upper abdominal pain onset today PTA. Pt afebrile. On exam patient with upper  abdominal TTP. Remainder of exam without acute findings. Differential diagnosis includes pancreatitis, cholecystitis, obstruction, ACS.  Labs:  I ordered, and personally interpreted labs.  The pertinent results include:  Urine notable for moderate hemoglobin, nitrite positive, many bacteria. Urine culture sent Lipase unremarkable. CMP with slightly decreased potassium at 3.1, slightly elevated creatinine at 1.06 otherwise unremarkable. CBC with leukocytosis at 12.   Imaging: I ordered imaging studies including CT abdomen pelvis with I independently visualized and interpreted imaging  which showed  1. Mild nonspecific dilation of the proximal to mid jejunum with  scattered gas fluid levels. This could reflect ileus or sequela of  enteritis. No high-grade bowel obstruction.  2. Faint nonobstructing right renal calculi measuring up to 3 mm. No  obstructive uropathy.  3. Normal appendix.  4.  Aortic Atherosclerosis (ICD10-I70.0).   I agree with the radiologist interpretation  Medications:  I ordered medication including Bactrim, morphine, potassium, IV fluids, Zofran  Reevaluation of the patient after these medicines and interventions, I reevaluated the patient and found that they have improved I have reviewed the patients home medicines and have made adjustments as needed Tolerated PO challenge in ED with above treatment regimen    Disposition: Patient presentation suspicious for acute cystitis with hematuria also notable for upper abdominal pain.  Doubt at this time concerns for cholecystitis, pancreatitis.  CT findings notable for ileus however no concerns at this time for bowel obstruction.  Doubt concerns at this time for ACS, patient asymptomatic without chest pain, EKG without acute ST/T changes.  After consideration of the diagnostic results and the patients response to treatment, I feel that the patient would benefit from Discharge home.  Patient discharged home with a prescription  for Bactrim, Zofran, potassium.  Short course of Percocet sent to patient's pharmacy.  Patient provided with information for GI specialist for follow-up.  Instructed to follow-up with her primary care provider regarding today's ED visit.  Patient able to tolerate p.o. intake prior to discharge.  Supportive care measures and strict return precautions discussed with patient at bedside. Pt acknowledges and verbalizes understanding. Pt appears safe for discharge. Follow up as indicated in discharge paperwork.   This chart was dictated using voice recognition software, Dragon. Despite the best efforts of this provider to proofread and correct errors, errors may still occur which can change documentation meaning.   Final Clinical Impression(s) / ED Diagnoses Final diagnoses:  Acute cystitis with hematuria  Upper abdominal pain    Rx / DC Orders ED Discharge Orders          Ordered    sulfamethoxazole-trimethoprim (BACTRIM DS) 800-160 MG tablet  2 times daily        05/31/22 1847    oxyCODONE-acetaminophen (PERCOCET/ROXICET) 5-325 MG tablet  Every 6 hours PRN        05/31/22 1849    ondansetron (ZOFRAN) 4 MG tablet  Every 8 hours PRN        05/31/22 1852    potassium chloride SA (KLOR-CON M) 20 MEQ tablet  2 times daily        05/31/22 1916              Tabita Corbo A, PA-C 05/31/22 1949    Gloris Manchester, MD 06/03/22 [phone]

## 2022-05-31 NOTE — Discharge Instructions (Addendum)
It was a pleasure taking care of you today!   Your labs show slightly elevated white blood cell count.  Otherwise your labs did not show any emergent findings today. Your CT showed concerns for slow moving bowels, however, no concern at this time for an obstruction of your bowels. Your urine was notable for a UTI. You will be sent a prescription for Bactrim, take as prescribed. Ensure that you complete the entire course of antibiotic. Ensure to maintain fluid intake.  You will be sent a short course of percocet, take as directed for breakthrough pain. Attached is information for GI specialist to follow-up regarding today's ED visit.  You will be sent a prescription for Zofran, take as directed for nausea/vomiting. If you take the zofran, wait at least 30 minutes before you attempt small sips and small bites of food/fluid. You will be sent a short course of potassium, take as directed. Follow up with your primary care provider this week. Attach is information for the GI specialist to follow up as needed.  Return to the emergency department if you are experiencing increasing/worsening symptoms.

## 2022-06-03 LAB — URINE CULTURE: Culture: >=100000 — AB

## 2022-06-04 ENCOUNTER — Telehealth (HOSPITAL_BASED_OUTPATIENT_CLINIC_OR_DEPARTMENT_OTHER): Payer: Self-pay | Admitting: *Deleted

## 2022-06-04 NOTE — Telephone Encounter (Signed)
Post ED Visit - Positive Culture Follow-up  Culture report reviewed by antimicrobial stewardship pharmacist: [place] Team '[]'$  Elenor Quinones, Pharm.D. '[]'$  Heide Guile, Pharm.D., BCPS AQ-ID '[]'$  Parks Neptune, Pharm.D., BCPS '[]'$  Alycia Rossetti, Pharm.D., BCPS '[]'$  [place], Pharm.D., BCPS, AAHIVP '[]'$  Legrand Como, Pharm.D., BCPS, AAHIVP '[]'$  Salome Arnt, PharmD, BCPS '[]'$  Johnnette Gourd, PharmD, BCPS '[]'$  Hughes Better, PharmD, BCPS '[]'$  Leeroy Cha, PharmD '[]'$  Laqueta Linden, PharmD, BCPS '[]'$  Albertina Parr, PharmD  [place] Team '[]'$  Leodis Sias, PharmD '[]'$  Lindell Spar, PharmD '[]'$  Royetta Asal, PharmD '[]'$  Graylin Shiver, Rph '[]'$  Rema Fendt) Glennon Mac, PharmD '[]'$  Arlyn Dunning, PharmD '[]'$  Netta Cedars, PharmD '[]'$  Dia Sitter, PharmD '[]'$  Leone Haven, PharmD '[]'$  Gretta Arab, PharmD '[]'$  Theodis Shove, PharmD '[]'$  Peggyann Juba, PharmD '[]'$  Reuel Boom, PharmD   Positive urine culture Treated with Silfamethoxazole-Trimethoprim, organism sensitive to the same and no further patient follow-up is required at this time. Mal Misty Dohler, Pharm D  [facility] 06/04/2022, 10:04 AM

## 2022-06-21 NOTE — Progress Notes (Unsigned)
Referring Provider: No ref. provider found Primary Care Physician:  System, Provider Not In Primary GI Physician: Dr. Gala Romney  No chief complaint on file.   HPI:   Rhonda Merritt is a 62 y.o. female presenting today with chief complaint of ***  She was seen in the ED 05/31/22 with acute onset upper abdominal pain with associated nausea. She was found to have hypokalemia, mild leukocytosis of 12. Lipase and Hgb normal. Urine culture positive for Klebsiella Pneumoniae. CT A/P w/o contrast with mild nonspecific dilation of proximal to mid jejunum with scattered gas fluid levels which could reflect ileus or sequela of enteritis. Faint nonobstructing right renal calculi measuring up to 3 mm. No obstructive uropathy. She was discharged with bactrim, zofran, potassium, and percocet.   Today:     Colonoscopy 08/04/2019 - One 8 mm polyp in descending colon removed with cold snare. Grade 1 internal hemorrhoids.  - Pathology: Tubular adenoma Recommendations: Repeat in 5 years.    Past Medical History:  Diagnosis Date   Chest wall pain    Chronic fatigue    Elevated blood sugar    Essential hypertension    History of colonic polyps    Hyperlipidemia    OAB (overactive bladder)    Osteoarthritis    Vertigo    Vitamin D deficiency     Past Surgical History:  Procedure Laterality Date   ABDOMINAL HYSTERECTOMY     CHOLECYSTECTOMY     COLONOSCOPY  2015   COLONOSCOPY N/A 08/04/2019   Procedure: COLONOSCOPY;  Surgeon: Daneil Dolin, MD;  Location: [facility];  Service: Endoscopy;  Laterality: N/A;  9:30   POLYPECTOMY  08/04/2019   Procedure: POLYPECTOMY;  Surgeon: Daneil Dolin, MD;  Location: [facility];  Service: Endoscopy;;    Current Outpatient Medications  Medication Sig Dispense Refill   albuterol (VENTOLIN HFA) 108 (90 Base) MCG/ACT inhaler Inhale 1-2 puffs into the lungs every 6 (six) hours as needed for shortness of breath or wheezing.     Black Cohosh 40 MG CAPS  Take 80 mg by mouth daily.      ergocalciferol (VITAMIN D2) 1.25 MG (50000 UT) capsule Take 1 capsule (50,000 Units total) by mouth once a week. (Patient taking differently: Take 50,000 Units by mouth 2 (two) times a week. ) 12 capsule 1   Glycerin-Hypromellose-PEG 400 ([employer] DRY EYE RELIEF) 0.2-0.2-1 % SOLN Place 1 drop into both eyes daily.     losartan-hydrochlorothiazide (HYZAAR) 100-25 MG tablet Take 1 tablet by mouth daily. 90 tablet 1   meclizine (ANTIVERT) 25 MG tablet Take 1 tablet (25 mg total) by mouth daily as needed for dizziness. 30 tablet 2   ondansetron (ZOFRAN) 4 MG tablet Take 1 tablet (4 mg total) by mouth every 8 (eight) hours as needed for nausea or vomiting. 12 tablet 0   oxyCODONE-acetaminophen (PERCOCET/ROXICET) 5-325 MG tablet Take 1 tablet by mouth every 6 (six) hours as needed for severe pain. 6 tablet 0   phentermine (ADIPEX-P) 37.5 MG tablet Take 1 tablet (37.5 mg total) by mouth daily before breakfast. 30 tablet 0   potassium chloride SA (KLOR-CON M) 20 MEQ tablet Take 2 tablets (40 mEq total) by mouth 2 (two) times daily for 5 days. 20 tablet 0   Pumpkin Seed-Soy Germ (AZO BLADDER CONTROL/GO-LESS PO) Take 1 tablet by mouth daily.      sodium chloride (AYR) 0.65 % nasal spray Place 1 spray into the nose daily.  triamcinolone cream (KENALOG) 0.1 % Apply 1 application topically 2 (two) times daily. 80 g 1   No current facility-administered medications for this visit.    Allergies as of 06/24/2022 - Review Complete 05/31/2022  Allergen Reaction Noted   Asa [aspirin] Hives 01/29/2015   Cefadroxil Hives 02/28/2019   Contrast media [iodinated contrast media] Hives 01/29/2015   Imitrex [sumatriptan] Palpitations and Other (See Comments) 01/29/2015    Family History  Problem Relation Age of Onset   Heart disease Father    Hypertension Father    Diabetes Mellitus II Father    Ovarian cancer Sister    Diabetes Mellitus II Maternal Grandmother    Breast cancer  Sister    Ovarian cancer Sister    Multiple sclerosis Mother    Arrhythmia Daughter     Social History   Socioeconomic History   Marital status: Married    Spouse name: Not on file   Number of children: Not on file   Years of education: Not on file   Highest education level: Not on file  Occupational History   Not on file  Tobacco Use   Smoking status: Every Day    Packs/day: 0.50    Types: Cigarettes   Smokeless tobacco: Never   Tobacco comments:    7 ciggs per day   Substance and Sexual Activity   Alcohol use: Not Currently    Comment: Occasional on special occasions   Drug use: No   Sexual activity: Not on file  Other Topics Concern   Not on file  Social History Narrative   Not on file   Social Determinants of Health   Financial Resource Strain: Not on file  Food Insecurity: Not on file  Transportation Needs: Not on file  Physical Activity: Not on file  Stress: Not on file  Social Connections: Not on file    Review of Systems: Gen: Denies fever, chills, anorexia. Denies fatigue, weakness, weight loss.  CV: Denies chest pain, palpitations, syncope, peripheral edema, and claudication. Resp: Denies dyspnea at rest, cough, wheezing, coughing up blood, and pleurisy. GI: Denies vomiting blood, jaundice, and fecal incontinence.   Denies dysphagia or odynophagia. Derm: Denies rash, itching, dry skin Psych: Denies depression, anxiety, memory loss, confusion. No homicidal or suicidal ideation.  Heme: Denies bruising, bleeding, and enlarged lymph nodes.  Physical Exam: There were no vitals taken for this visit. General:   Alert and oriented. No distress noted. Pleasant and cooperative.  Head:  Normocephalic and atraumatic. Eyes:  Conjuctiva clear without scleral icterus. Heart:  S1, S2 present without murmurs appreciated. Lungs:  Clear to auscultation bilaterally. No wheezes, rales, or rhonchi. No distress.  Abdomen:  +BS, soft, non-tender and non-distended. No  rebound or guarding. No HSM or masses noted. Msk:  Symmetrical without gross deformities. Normal posture. Extremities:  Without edema. Neurologic:  Alert and  oriented x4 Psych:  Normal mood and affect.    Assessment:     Plan:  ***   Aliene Altes, PA-C [facility] 06/24/2022

## 2022-06-23 ENCOUNTER — Emergency Department (HOSPITAL_COMMUNITY): Payer: 59

## 2022-06-23 ENCOUNTER — Emergency Department (HOSPITAL_COMMUNITY)
Admission: EM | Admit: 2022-06-23 | Discharge: 2022-06-23 | Disposition: A | Discharge status: Home / Self Care | Payer: 59 | Attending: Emergency Medicine | Admitting: Emergency Medicine

## 2022-06-23 ENCOUNTER — Other Ambulatory Visit: Payer: Self-pay

## 2022-06-23 ENCOUNTER — Encounter (HOSPITAL_COMMUNITY): Payer: Self-pay

## 2022-06-23 DIAGNOSIS — M542 Cervicalgia: Secondary | ICD-10-CM | POA: Insufficient documentation

## 2022-06-23 MED ORDER — DIAZEPAM 5 MG PO TABS
5.0000 mg | ORAL_TABLET | Freq: Once | ORAL | Status: AC
  Administered 2022-06-23: 5 mg via ORAL
  Filled 2022-06-23: qty 1

## 2022-06-23 MED ORDER — OXYCODONE-ACETAMINOPHEN 5-325 MG PO TABS
1.0000 | ORAL_TABLET | Freq: Once | ORAL | Status: AC
  Administered 2022-06-23: 1 via ORAL
  Filled 2022-06-23: qty 1

## 2022-06-23 MED ORDER — LIDOCAINE 5 % EX PTCH
1.0000 | MEDICATED_PATCH | CUTANEOUS | Status: DC
  Administered 2022-06-23: 1 via TRANSDERMAL
  Filled 2022-06-23: qty 1

## 2022-06-23 NOTE — ED Notes (Signed)
Pt given ice pack for neck

## 2022-06-23 NOTE — ED Notes (Signed)
See triage notes. Nad. Denies injury. Has not taken any pain meds today

## 2022-06-23 NOTE — ED Provider Notes (Signed)
Physical Exam  BP (!) 133/92 (BP Location: Left Arm)   Pulse 88   Temp 97.9 F (36.6 C) (Oral)   Resp 18   Ht '5\' 5"'$  (1.651 m)   Wt 93 kg   SpO2 100%   BMI 34.11 kg/m   Physical Exam Vitals and nursing note reviewed.  Constitutional:      Appearance: Normal appearance.  Eyes:     General: No scleral icterus. Neck:     Comments: Limited ROM 2/2 pain. Palpable muscle spasm to the right lateral neck/superior right shoulder. No crepitus, erythema, or increase in warmth. No fluctuance or induration to the area. No step offs or deformities.  Pulmonary:     Effort: Pulmonary effort is normal. No respiratory distress.  Musculoskeletal:     Cervical back: Tenderness present.  Lymphadenopathy:     Cervical: No cervical adenopathy.  Skin:    General: Skin is dry.     Findings: No rash.  Neurological:     General: No focal deficit present.     Mental Status: She is alert. Mental status is at baseline.  Psychiatric:        Mood and Affect: Mood normal.     Procedures  Procedures  ED Course / MDM    Medical Decision Making Amount and/or Complexity of Data Reviewed Radiology: ordered.  Risk Prescription drug management.   Accepted handoff at shift change from Alyse Low, [place]. Please see prior provider note for more detail.   Briefly: Patient is 62 y.o. F presenting with neck pain and stiffness  DDX: concern for CT abnormality.   Plan: Follow up on CT imaging and reassess.  Patient was given Valium for symptoms. On re-evaluation, Patient is able to move her neck some. Will re-dose with valium and narcotic.  Patient's imaging shows no acute cervical spine fracture. 2. Mild lower cervical spondylosis as above.   On re-evaluation, patient is doing much better and can almost fully move her head to the right. I doubt any meningitis or dissection. She reports that she was lifting her arms above her head a lot yesterday taking down [employer]. She denies any  traumas or falls. Equal strength and sensation in her bilateral upper arms. I do not think she needs any additional emergent imaging and can follow up outpatient.   Patient already has prescription muscle relaxers as well as narcotic pain medication. We reviewed and discussed dosing and management including heat/ice and lidocaine patches. Advise that she follow up with her back specialist for this as well. We discussed strict return precautions and red flag symptoms. The patient verbalizes her understanding and agrees to the plan. Husband is driving her home. The patient is stable and being discharged home in good condition.    CT Cervical Spine Wo Contrast  Result Date: 06/23/2022 CLINICAL DATA:  Right-sided neck and shoulder pain since yesterday, no known injury EXAM: CT CERVICAL SPINE WITHOUT CONTRAST TECHNIQUE: Multidetector CT imaging of the cervical spine was performed without intravenous contrast. Multiplanar CT image reconstructions were also generated. RADIATION DOSE REDUCTION: This exam was performed according to the departmental dose-optimization program which includes automated exposure control, adjustment of the mA and/or kV according to patient size and/or use of iterative reconstruction technique. COMPARISON:  None Available. FINDINGS: Alignment: Mild right convex scoliosis centered at the cervicothoracic junction. Straightening of the cervical spine with loss of normal lordosis could be due to lower cervical spondylosis or patient positioning. Skull base and vertebrae: No acute fracture. No primary  bone lesion or focal pathologic process. Soft tissues and spinal canal: No prevertebral fluid or swelling. No visible canal hematoma. Disc levels: Prominent lower cervical spondylosis greatest at C4-5, C5-6, and C6-7. Mild left predominant neural foraminal encroachment at C5-6 and C6-7. Upper chest: Airway is patent. Visualized portions of the lung apices are clear. Other: Reconstructed images  demonstrate no additional findings. IMPRESSION: 1. No acute cervical spine fracture. 2. Mild lower cervical spondylosis as above. Electronically Signed   By: Randa Ngo M.D.   On: 06/23/2022 19:33         Sherrell Puller, PA-C 06/24/22 0126    Sherwood Gambler, MD 06/26/22 [phone]

## 2022-06-23 NOTE — ED Notes (Signed)
EDPA Provider at bedside. 

## 2022-06-23 NOTE — ED Triage Notes (Signed)
Complains of right side neck and shoulder pain.  Reports it started yesterday.  Reports holding her arm up helps the pain.

## 2022-06-23 NOTE — ED Notes (Signed)
Patient transported to CT 

## 2022-06-23 NOTE — ED Provider Notes (Signed)
[facility] Provider Note   CSN: [id number] Arrival date & time: 06/23/22  1712     History  Chief Complaint  Patient presents with   Torticollis    Rhonda Merritt is a 62 y.o. female.  Patient complains of severe pain in the back of her neck patient reports she has pain when she tries to turn her head side-to-side patient reports that she noticed the pain this a.m.  Patient reports pain became worse when she was trying to drive.  Patient reports she has difficulty turning her head to see the vehicles beside her.  Patient reports taking a small piece of an oxycodone for pain.  Patient reports she did not have any relief  The history is provided by the patient. No language interpreter was used.       Home Medications Prior to Admission medications   Medication Sig Start Date End Date Taking? Authorizing Provider  albuterol (VENTOLIN HFA) 108 (90 Base) MCG/ACT inhaler Inhale 1-2 puffs into the lungs every 6 (six) hours as needed for shortness of breath or wheezing.    [provider]  Black Cohosh 40 MG CAPS Take 80 mg by mouth daily.     [provider]  ergocalciferol (VITAMIN D2) 1.25 MG (50000 UT) capsule Take 1 capsule (50,000 Units total) by mouth once a week. Patient taking differently: Take 50,000 Units by mouth 2 (two) times a week.  02/28/19   Loman Brooklyn, FNP  Glycerin-Hypromellose-PEG 400 ([employer] DRY EYE RELIEF) 0.2-0.2-1 % SOLN Place 1 drop into both eyes daily.    [provider]  losartan-hydrochlorothiazide (HYZAAR) 100-25 MG tablet Take 1 tablet by mouth daily. 10/17/19   Loman Brooklyn, FNP  meclizine (ANTIVERT) 25 MG tablet Take 1 tablet (25 mg total) by mouth daily as needed for dizziness. 10/17/19   Loman Brooklyn, FNP  ondansetron (ZOFRAN) 4 MG tablet Take 1 tablet (4 mg total) by mouth every 8 (eight) hours as needed for nausea or vomiting. 05/31/22   Blue, Soijett A, PA-C   oxyCODONE-acetaminophen (PERCOCET/ROXICET) 5-325 MG tablet Take 1 tablet by mouth every 6 (six) hours as needed for severe pain. 05/31/22   Blue, Soijett A, PA-C  phentermine (ADIPEX-P) 37.5 MG tablet Take 1 tablet (37.5 mg total) by mouth daily before breakfast. 10/17/19   Loman Brooklyn, FNP  potassium chloride SA (KLOR-CON M) 20 MEQ tablet Take 2 tablets (40 mEq total) by mouth 2 (two) times daily for 5 days. 05/31/22 06/05/22  Blue, Soijett A, PA-C  Pumpkin Seed-Soy Germ (AZO BLADDER CONTROL/GO-LESS PO) Take 1 tablet by mouth daily.     [provider]  sodium chloride (AYR) 0.65 % nasal spray Place 1 spray into the nose daily.    [provider]  triamcinolone cream (KENALOG) 0.1 % Apply 1 application topically 2 (two) times daily. 10/17/19   Loman Brooklyn, FNP      Allergies    Asa [aspirin], Cefadroxil, Contrast media [iodinated contrast media], and Imitrex [sumatriptan]    Review of Systems   Review of Systems  All other systems reviewed and are negative.   Physical Exam Updated Vital Signs BP (!) 133/92 (BP Location: Left Arm)   Pulse 88   Temp 97.9 F (36.6 C) (Oral)   Resp 18   Ht '5\' 5"'$  (1.651 m)   Wt 93 kg   SpO2 100%   BMI 34.11 kg/m  Physical Exam Vitals reviewed.  Constitutional:  Appearance: Normal appearance.  HENT:     Head: Normocephalic.  Neck:     Comments: Tender posterior neck see 6 C7 range, pain with range of motion Cardiovascular:     Rate and Rhythm: Normal rate and regular rhythm.  Pulmonary:     Effort: Pulmonary effort is normal.  Abdominal:     General: Abdomen is flat.  Musculoskeletal:     Cervical back: Normal range of motion. Tenderness present.  Skin:    General: Skin is warm.  Neurological:     General: No focal deficit present.     Mental Status: She is alert.  Psychiatric:        Mood and Affect: Mood normal.     ED Results / Procedures / Treatments   Labs (all labs ordered are listed, but only  abnormal results are displayed) Labs Reviewed - No data to display  EKG None  Radiology No results found.  Procedures Procedures    Medications Ordered in ED Medications  diazepam (VALIUM) tablet 5 mg (5 mg Oral Given 06/23/22 1827)    ED Course/ Medical Decision Making/ A&P                             Medical Decision Making She complains of pain with turning her head side-to-side patient reports his pain in the lower part of her neck  Amount and/or Complexity of Data Reviewed Independent Historian: spouse    Details: With her spouse who is supportive Radiology: ordered and independent interpretation performed. Decision-making details documented in ED Course.    Details: CT cervical spine ordered  Risk Prescription drug management. Risk Details: Patient given Valium 5 mg p.o. for symptoms.   Pt's care turned over to [facility]  PA at &:30.         Final Clinical Impression(s) / ED Diagnoses Final diagnoses:  Neck pain    Rx / DC Orders ED Discharge Orders     None         Sidney Ace 06/23/22 [place], MD 06/26/22 1109

## 2022-06-23 NOTE — Discharge Instructions (Addendum)
You were seen in the ER today for evaluation of your right sided neck pain. I likely think this is a muscle spasm. Please take the pain medication and muscle relaxers you have at home for this as directed. You can also try topical lidocaine patches for this as well. You can also try gentle stretching. Please follow up with your provider who sees you for your back. Remember to try heat and/or ice. If you have any concerns, new or worsening symptoms, please return to the nearest ER for re-evaluation.   Contact a doctor if: Your condition does not get better with treatment. Get help right away if: Your pain gets worse and medicine does not help. You lose feeling or feel weak in your hand, arm, face, or leg. You have a high fever. Your neck is stiff. You cannot control when you poop or pee (have incontinence). You have trouble with walking, balance, or talking.

## 2022-06-24 ENCOUNTER — Encounter: Payer: Self-pay | Admitting: Gastroenterology

## 2022-06-24 ENCOUNTER — Ambulatory Visit (INDEPENDENT_AMBULATORY_CARE_PROVIDER_SITE_OTHER): Payer: 59 | Admitting: Gastroenterology

## 2022-06-24 VITALS — BP 117/78 | HR 80 | Temp 97.4°F | Ht 65.0 in | Wt 218.6 lb

## 2022-06-24 DIAGNOSIS — R1030 Lower abdominal pain, unspecified: Secondary | ICD-10-CM

## 2022-06-24 DIAGNOSIS — R195 Other fecal abnormalities: Secondary | ICD-10-CM | POA: Diagnosis not present

## 2022-06-24 NOTE — Patient Instructions (Signed)
I suspect that you have may have had acute viral gastroenteritis causing your upper abdominal pain and nausea as well as mild change in your bowel habits.  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  This can take several weeks to months to resolve.  For now, I recommend that we continue to monitor your symptoms.  Start a daily probiotic such as align, digestive advantage, Hardin Negus' colon health, or Restora.  We will plan to follow-up with you in about 3 months.  If you have any worsening symptoms prior to your next visit, please let me know.  It was a pleasure to meet you today! I want to create trusting relationships with patients. If you receive a survey regarding your visit,  I greatly appreciate you taking time to fill this out on paper or through your MyChart. I value your feedback.  Aliene Altes, PA-C [facility]

## 2022-08-14 ENCOUNTER — Encounter: Payer: Self-pay | Admitting: Gastroenterology

## 2023-03-03 ENCOUNTER — Emergency Department (HOSPITAL_COMMUNITY): Payer: 59

## 2023-03-03 ENCOUNTER — Emergency Department (HOSPITAL_COMMUNITY)
Admission: EM | Admit: 2023-03-03 | Discharge: 2023-03-04 | Disposition: A | Discharge status: Home / Self Care | Payer: 59 | Attending: Emergency Medicine | Admitting: Emergency Medicine

## 2023-03-03 ENCOUNTER — Encounter (HOSPITAL_COMMUNITY): Payer: Self-pay | Admitting: Radiology

## 2023-03-03 DIAGNOSIS — K30 Functional dyspepsia: Secondary | ICD-10-CM | POA: Diagnosis not present

## 2023-03-03 DIAGNOSIS — R0789 Other chest pain: Secondary | ICD-10-CM | POA: Diagnosis present

## 2023-03-03 LAB — BASIC METABOLIC PANEL
Anion gap: 10 (ref 5–15)
BUN: 20 mg/dL (ref 6–20)
CO2: 27 mmol/L (ref 22–32)
Calcium: 9.9 mg/dL (ref 8.9–10.3)
Chloride: 100 mmol/L (ref 98–111)
Creatinine, Ser: 1.05 mg/dL — ABNORMAL HIGH (ref 0.44–1.00)
GFR, Estimated: >60 mL/min (ref >60)
Glucose, Bld: 107 mg/dL — ABNORMAL HIGH (ref 70–99)
Potassium: 4.2 mmol/L (ref 3.5–5.1)
Sodium: 137 mmol/L (ref 135–145)

## 2023-03-03 LAB — CBC
HCT: 42.4 % (ref 36.0–46.0)
Hemoglobin: 13.3 g/dL (ref 12.0–15.0)
MCH: 26.2 pg (ref 26.0–34.0)
MCHC: 31.4 g/dL (ref 30.0–36.0)
MCV: 83.6 fL (ref 80.0–100.0)
Platelets: 345 10*3/uL (ref 150–400)
RBC: 5.07 MIL/uL (ref 3.87–5.11)
RDW: 16.4 % — ABNORMAL HIGH (ref 11.5–15.5)
WBC: 12.4 10*3/uL — ABNORMAL HIGH (ref 4.0–10.5)
nRBC: 0 % (ref 0.0–0.2)

## 2023-03-03 LAB — TROPONIN I (HIGH SENSITIVITY): Troponin I (High Sensitivity): 4 ng/L (ref <18)

## 2023-03-03 NOTE — ED Triage Notes (Signed)
Pt states she has had severe indigestion for the past week. Pt states she took some of her husbands zantac pills and it helped the indigestions. Pt states for the past 2 days she has had tightness in her chest. Denies pain radiating to any other place. Pt states she has had to get a refill on her inhaler due to having frequent coughing spells.

## 2023-03-03 NOTE — ED Triage Notes (Signed)
Pt endorses having a cold recently and has had to take cough syrup and use cough drops.

## 2023-03-04 LAB — TROPONIN I (HIGH SENSITIVITY): Troponin I (High Sensitivity): 4 ng/L (ref <18)

## 2023-03-04 LAB — HEPATIC FUNCTION PANEL
ALT: 24 U/L (ref 0–44)
AST: 25 U/L (ref 15–41)
Albumin: 3.6 g/dL (ref 3.5–5.0)
Alkaline Phosphatase: 74 U/L (ref 38–126)
Bilirubin, Direct: <0.1 mg/dL (ref 0.0–0.2)
Total Bilirubin: 0.3 mg/dL (ref <1.2)
Total Protein: 8.2 g/dL — ABNORMAL HIGH (ref 6.5–8.1)

## 2023-03-04 LAB — LIPASE, BLOOD: Lipase: 29 U/L (ref 11–51)

## 2023-03-04 MED ORDER — ALUM & MAG HYDROXIDE-SIMETH 200-200-20 MG/5ML PO SUSP
30.0000 mL | Freq: Once | ORAL | Status: AC
  Administered 2023-03-04: 30 mL via ORAL
  Filled 2023-03-04: qty 30

## 2023-03-04 MED ORDER — OMEPRAZOLE 20 MG PO CPDR: 20.0000 mg | DELAYED_RELEASE_CAPSULE | Freq: Every day | ORAL | 1 refills | Status: AC

## 2023-03-04 NOTE — ED Notes (Signed)
ED Provider at bedside. 

## 2023-03-04 NOTE — ED Notes (Signed)
Pt states they feel like food is getting stuck and makes pt nauseated. Pt states appetite has decreased d/t the same. Pt states that these symptoms have been going on for at least a month however the pain associated with s/sx of GERD is new

## 2023-03-04 NOTE — Discharge Instructions (Signed)
You were seen today for nonspecific chest pain and epigastric pain.  This is likely related to gastritis.  You should stop all NSAIDs.  Start omeprazole daily.  Follow-up with gastroenterology if not improving.

## 2023-03-04 NOTE — ED Provider Notes (Signed)
[place] EMERGENCY DEPARTMENT AT [facility] Provider Note   CSN: [id number] Arrival date & time: 03/03/23  2022     History  Chief Complaint  Patient presents with   Chest Pain    Rhonda Merritt is a 62 y.o. female.  HPI     This is a 62 year old female who presents with indigestion.  Patient reports over the last several weeks she has had worsening more frequent episodes of what she describes as indigestion.  She states that she feels like food is getting stuck and she has burning sensation mostly after eating.  She has had decreased appetite.  She has tried Zantac with some relief.  Reports over last 3 to 4 months she has increased her NSAID use secondary to a back injury.  She states over the last 12 to 24 hours she has had worsening more persistent discomfort.  Home Medications Prior to Admission medications   Medication Sig Start Date End Date Taking? Authorizing Provider  omeprazole (PRILOSEC) 20 MG capsule Take 1 capsule (20 mg total) by mouth daily. 03/04/23  Yes Becci Batty, Mayer Masker, MD  albuterol (VENTOLIN HFA) 108 (90 Base) MCG/ACT inhaler Inhale 1-2 puffs into the lungs every 6 (six) hours as needed for shortness of breath or wheezing.    [provider]  Black Cohosh 40 MG CAPS Take 80 mg by mouth daily.     [provider]  ergocalciferol (VITAMIN D2) 1.25 MG (50000 UT) capsule Take 1 capsule (50,000 Units total) by mouth once a week. Patient taking differently: Take 50,000 Units by mouth 2 (two) times a week. 02/28/19   Gwenlyn Fudge, FNP  Glycerin-Hypromellose-PEG 400 ([employer] DRY EYE RELIEF) 0.2-0.2-1 % SOLN Place 1 drop into both eyes daily.    [provider]  losartan-hydrochlorothiazide (HYZAAR) 100-25 MG tablet Take 1 tablet by mouth daily. 10/17/19   Gwenlyn Fudge, FNP  meclizine (ANTIVERT) 25 MG tablet Take 1 tablet (25 mg total) by mouth daily as needed for dizziness. Patient not taking: Reported on 06/24/2022  10/17/19   Gwenlyn Fudge, FNP  ondansetron (ZOFRAN) 4 MG tablet Take 1 tablet (4 mg total) by mouth every 8 (eight) hours as needed for nausea or vomiting. Patient not taking: Reported on 06/24/2022 05/31/22   Blue, Soijett A, PA-C  oxyCODONE-acetaminophen (PERCOCET/ROXICET) 5-325 MG tablet Take 1 tablet by mouth every 6 (six) hours as needed for severe pain. 05/31/22   Blue, Soijett A, PA-C  phentermine (ADIPEX-P) 37.5 MG tablet Take 1 tablet (37.5 mg total) by mouth daily before breakfast. 10/17/19   Gwenlyn Fudge, FNP  potassium chloride SA (KLOR-CON M) 20 MEQ tablet Take 2 tablets (40 mEq total) by mouth 2 (two) times daily for 5 days. Patient not taking: Reported on 06/24/2022 05/31/22 06/05/22  Blue, Soijett A, PA-C  Pumpkin Seed-Soy Germ (AZO BLADDER CONTROL/GO-LESS PO) Take 1 tablet by mouth daily.     [provider]  sodium chloride (AYR) 0.65 % nasal spray Place 1 spray into the nose daily.    [provider]  triamcinolone cream (KENALOG) 0.1 % Apply 1 application topically 2 (two) times daily. Patient not taking: Reported on 06/24/2022 10/17/19   Gwenlyn Fudge, FNP      Allergies    Asa [aspirin], Cefadroxil, Contrast media [iodinated contrast media], and Imitrex [sumatriptan]    Review of Systems   Review of Systems  Constitutional:  Negative for fever.  Respiratory:  Negative for shortness of breath.  Cardiovascular:  Positive for chest pain. Negative for leg swelling.  Gastrointestinal:  Positive for abdominal pain. Negative for nausea and vomiting.  All other systems reviewed and are negative.   Physical Exam Updated Vital Signs BP 129/89   Pulse 80   Temp 97.6 F (36.4 C) (Oral)   Resp 17   Ht 1.651 m (5\' 5" )   Wt 98.9 kg   SpO2 97%   BMI 36.28 kg/m  Physical Exam Vitals and nursing note reviewed.  Constitutional:      Appearance: She is well-developed. She is obese. She is not ill-appearing.  HENT:     Head: Normocephalic and atraumatic.   Eyes:     Pupils: Pupils are equal, round, and reactive to light.  Cardiovascular:     Rate and Rhythm: Normal rate and regular rhythm.     Heart sounds: Normal heart sounds.  Pulmonary:     Effort: Pulmonary effort is normal. No respiratory distress.     Breath sounds: No wheezing.  Abdominal:     General: Bowel sounds are normal.     Palpations: Abdomen is soft.  Musculoskeletal:     Cervical back: Neck supple.  Skin:    General: Skin is warm and dry.  Neurological:     General: No focal deficit present.     Mental Status: She is alert and oriented to person, place, and time.     ED Results / Procedures / Treatments   Labs (all labs ordered are listed, but only abnormal results are displayed) Labs Reviewed  BASIC METABOLIC PANEL - Abnormal; Notable for the following components:      Result Value   Glucose, Bld 107 (*)    Creatinine, Ser 1.05 (*)    All other components within normal limits  CBC - Abnormal; Notable for the following components:   WBC 12.4 (*)    RDW 16.4 (*)    All other components within normal limits  HEPATIC FUNCTION PANEL - Abnormal; Notable for the following components:   Total Protein 8.2 (*)    All other components within normal limits  LIPASE, BLOOD  TROPONIN I (HIGH SENSITIVITY)  TROPONIN I (HIGH SENSITIVITY)    EKG EKG Interpretation Date/Time:  Wednesday March 03 2023 20:50:36 EST Ventricular Rate:  85 PR Interval:  162 QRS Duration:  94 QT Interval:  378 QTC Calculation: 449 R Axis:   32  Text Interpretation: Normal sinus rhythm Cannot rule out Anterior infarct (cited on or before 03-Mar-2023) Abnormal ECG When compared with ECG of 31-May-2022 14:16, Premature ventricular complexes are no longer Present Confirmed by Ross Marcus ([id number]) on 03/03/2023 11:01:11 PM  Radiology DG Chest 2 View  Result Date: 03/03/2023 CLINICAL DATA:  Chest pain EXAM: CHEST - 2 VIEW COMPARISON:  01/29/2015 FINDINGS: The heart size and  mediastinal contours are within normal limits. Surgical clips again noted within the anterior mediastinum. Both lungs are clear. The visualized skeletal structures are unremarkable. IMPRESSION: No active cardiopulmonary disease. Electronically Signed   By: Helyn Numbers M.D.   On: 03/03/2023 21:40    Procedures Procedures    Medications Ordered in ED Medications  alum & mag hydroxide-simeth (MAALOX/MYLANTA) 200-200-20 MG/5ML suspension 30 mL (30 mLs Oral Given 03/04/23 0056)    ED Course/ Medical Decision Making/ A&P                                 Medical Decision Making Amount  and/or Complexity of Data Reviewed Labs: ordered. Radiology: ordered.  Risk OTC drugs. Prescription drug management.   This patient presents to the ED for concern of chest pain, this involves an extensive number of treatment options, and is a complaint that carries with it a high risk of complications and morbidity.  I considered the following differential and admission for this acute, potentially life threatening condition.  The differential diagnosis includes ACS, PE, pneumothorax, pneumonia, GI etiology such as reflux or gastritis  MDM:    This is a 62 year old female who presents with atypical chest pain.  History is most suggestive of GI etiology.  She is overall nontoxic and vital signs are reassuring.  EKG shows no evidence of ischemia.  Troponin x 2 negative.  LFTs and lipase are reassuring.  Patient was given GI cocktail with some relief of her symptoms.  She is not regularly taking a PPI.  Given postprandial symptoms and nature of pain, would recommend that she start omeprazole daily.  Given GI follow-up.  Low suspicion for ACS or cardiac etiology at this time.  (Labs, imaging, consults)  Labs: I Ordered, and personally interpreted labs.  The pertinent results include: CBC, CMP, lipase, troponin x 2  Imaging Studies ordered: I ordered imaging studies including chest x-ray I independently  visualized and interpreted imaging. I agree with the radiologist interpretation  Additional history obtained from husband at bedside.  External records from outside source obtained and reviewed including prior evaluations  Cardiac Monitoring: The patient was maintained on a cardiac monitor.  If on the cardiac monitor, I personally viewed and interpreted the cardiac monitored which showed an underlying rhythm of: Sinus rhythm  Reevaluation: After the interventions noted above, I reevaluated the patient and found that they have :improved  Social Determinants of Health:  lives independently  Disposition: Discharge  Co morbidities that complicate the patient evaluation  Past Medical History:  Diagnosis Date   Chest wall pain    Chronic fatigue    Elevated blood sugar    Essential hypertension    History of colonic polyps    Hyperlipidemia    OAB (overactive bladder)    Osteoarthritis    Vertigo    Vitamin D deficiency      Medicines Meds ordered this encounter  Medications   alum & mag hydroxide-simeth (MAALOX/MYLANTA) 200-200-20 MG/5ML suspension 30 mL   omeprazole (PRILOSEC) 20 MG capsule    Sig: Take 1 capsule (20 mg total) by mouth daily.    Dispense:  30 capsule    Refill:  1    I have reviewed the patients home medicines and have made adjustments as needed  Problem List / ED Course: Problem List Items Addressed This Visit   None Visit Diagnoses     Atypical chest pain    -  Primary                   Final Clinical Impression(s) / ED Diagnoses Final diagnoses:  Atypical chest pain    Rx / DC Orders ED Discharge Orders          Ordered    omeprazole (PRILOSEC) 20 MG capsule  Daily        03/04/23 0207              Shon Baton, MD 03/05/23 [phone]

## 2023-06-04 ENCOUNTER — Emergency Department (HOSPITAL_COMMUNITY): Payer: 59

## 2023-06-04 ENCOUNTER — Other Ambulatory Visit: Payer: Self-pay

## 2023-06-04 ENCOUNTER — Emergency Department (HOSPITAL_COMMUNITY)
Admission: EM | Admit: 2023-06-04 | Discharge: 2023-06-04 | Disposition: A | Discharge status: Home / Self Care | Payer: 59 | Attending: Emergency Medicine | Admitting: Emergency Medicine

## 2023-06-04 DIAGNOSIS — I10 Essential (primary) hypertension: Secondary | ICD-10-CM | POA: Diagnosis not present

## 2023-06-04 DIAGNOSIS — M79604 Pain in right leg: Secondary | ICD-10-CM | POA: Insufficient documentation

## 2023-06-04 DIAGNOSIS — Z79899 Other long term (current) drug therapy: Secondary | ICD-10-CM | POA: Diagnosis not present

## 2023-06-04 DIAGNOSIS — N3001 Acute cystitis with hematuria: Secondary | ICD-10-CM | POA: Diagnosis not present

## 2023-06-04 DIAGNOSIS — M79605 Pain in left leg: Secondary | ICD-10-CM | POA: Insufficient documentation

## 2023-06-04 DIAGNOSIS — R110 Nausea: Secondary | ICD-10-CM | POA: Diagnosis present

## 2023-06-04 LAB — COMPREHENSIVE METABOLIC PANEL
ALT: 19 U/L (ref 0–44)
AST: 22 U/L (ref 15–41)
Albumin: 3.3 g/dL — ABNORMAL LOW (ref 3.5–5.0)
Alkaline Phosphatase: 60 U/L (ref 38–126)
Anion gap: 10 (ref 5–15)
BUN: 19 mg/dL (ref 8–23)
CO2: 28 mmol/L (ref 22–32)
Calcium: 9.6 mg/dL (ref 8.9–10.3)
Chloride: 100 mmol/L (ref 98–111)
Creatinine, Ser: 1.01 mg/dL — ABNORMAL HIGH (ref 0.44–1.00)
GFR, Estimated: >60 mL/min (ref >60)
Glucose, Bld: 89 mg/dL (ref 70–99)
Potassium: 3.8 mmol/L (ref 3.5–5.1)
Sodium: 138 mmol/L (ref 135–145)
Total Bilirubin: 0.6 mg/dL (ref 0.0–1.2)
Total Protein: 7.6 g/dL (ref 6.5–8.1)

## 2023-06-04 LAB — URINALYSIS, ROUTINE W REFLEX MICROSCOPIC
Bilirubin Urine: NEGATIVE
Glucose, UA: NEGATIVE mg/dL
Ketones, ur: NEGATIVE mg/dL
Leukocytes,Ua: NEGATIVE
Nitrite: POSITIVE — AB
Protein, ur: 100 mg/dL — AB
RBC / HPF: >50 RBC/hpf (ref 0–5)
Specific Gravity, Urine: 1.018 (ref 1.005–1.030)
pH: 5 (ref 5.0–8.0)

## 2023-06-04 LAB — CBC WITH DIFFERENTIAL/PLATELET
Abs Immature Granulocytes: 0.03 10*3/uL (ref 0.00–0.07)
Basophils Absolute: 0 10*3/uL (ref 0.0–0.1)
Basophils Relative: 0 %
Eosinophils Absolute: 0.1 10*3/uL (ref 0.0–0.5)
Eosinophils Relative: 1 %
HCT: 40.3 % (ref 36.0–46.0)
Hemoglobin: 12.8 g/dL (ref 12.0–15.0)
Immature Granulocytes: 0 %
Lymphocytes Relative: 28 %
Lymphs Abs: 3 10*3/uL (ref 0.7–4.0)
MCH: 26 pg (ref 26.0–34.0)
MCHC: 31.8 g/dL (ref 30.0–36.0)
MCV: 81.7 fL (ref 80.0–100.0)
Monocytes Absolute: 0.5 10*3/uL (ref 0.1–1.0)
Monocytes Relative: 5 %
Neutro Abs: 6.9 10*3/uL (ref 1.7–7.7)
Neutrophils Relative %: 66 %
Platelets: 320 10*3/uL (ref 150–400)
RBC: 4.93 MIL/uL (ref 3.87–5.11)
RDW: 15.9 % — ABNORMAL HIGH (ref 11.5–15.5)
WBC: 10.5 10*3/uL (ref 4.0–10.5)
nRBC: 0 % (ref 0.0–0.2)

## 2023-06-04 LAB — LIPASE, BLOOD: Lipase: 26 U/L (ref 11–51)

## 2023-06-04 MED ORDER — SULFAMETHOXAZOLE-TRIMETHOPRIM 800-160 MG PO TABS
1.0000 | ORAL_TABLET | Freq: Once | ORAL | Status: AC
  Administered 2023-06-04: 1 via ORAL
  Filled 2023-06-04: qty 1

## 2023-06-04 MED ORDER — SULFAMETHOXAZOLE-TRIMETHOPRIM 800-160 MG PO TABS: 1.0000 | ORAL_TABLET | Freq: Two times a day (BID) | ORAL | 0 refills | Status: AC

## 2023-06-04 MED ORDER — MORPHINE SULFATE (PF) 4 MG/ML IV SOLN
3.0000 mg | Freq: Once | INTRAVENOUS | Status: AC
  Administered 2023-06-04: 3 mg via INTRAVENOUS
  Filled 2023-06-04: qty 1

## 2023-06-04 MED ORDER — ONDANSETRON HCL 4 MG/2ML IJ SOLN
4.0000 mg | Freq: Once | INTRAMUSCULAR | Status: AC
Start: 2023-06-04 — End: 2023-06-04
  Administered 2023-06-04: 4 mg via INTRAVENOUS
  Filled 2023-06-04: qty 2

## 2023-06-04 MED ORDER — SODIUM CHLORIDE 0.9 % IV BOLUS
1000.0000 mL | Freq: Once | INTRAVENOUS | Status: AC
Start: 2023-06-04 — End: 2023-06-04
  Administered 2023-06-04: 1000 mL via INTRAVENOUS

## 2023-06-04 MED ORDER — ONDANSETRON HCL 4 MG PO TABS: 4.0000 mg | ORAL_TABLET | Freq: Four times a day (QID) | ORAL | 0 refills | Status: AC

## 2023-06-04 NOTE — ED Triage Notes (Signed)
 Pt c/o nausea and abdominal pain starting yesterday. Also endorsing leg cramping and blood in urine starting this morning., Denies urinary frequency, SOB, and CP.

## 2023-06-04 NOTE — Discharge Instructions (Signed)
 Your workup this evening shows that you have a urinary tract infection.  Is important that you drink plenty of water and/or cranberry juice.  Take the antibiotic as directed until finished.  You may take the first dose of your antibiotic tonight before bedtime.  Tylenol  every 4 hours if needed for pain.  Please follow-up with your primary care provider next week for recheck.  Return to the emergency department if you develop any new or worsening symptoms.

## 2023-06-07 NOTE — ED Provider Notes (Signed)
 [place] EMERGENCY DEPARTMENT AT [facility] Provider Note   CSN: [id number] Arrival date & time: 06/04/23  0818     History  Chief Complaint  Patient presents with   Nausea    Pt c/o nausea and abdominal pain starting yesterday. Also endorsing leg cramping and blood in urine starting this morning., Denies urinary frequency, SOB, and CP.   Leg Pain   Hematuria    Rhonda Merritt is a 63 y.o. female.   Leg Pain Associated symptoms: no back pain and no fever   Hematuria Associated symptoms include abdominal pain. Pertinent negatives include no chest pain and no shortness of breath.        Rhonda Merritt is a 63 y.o. female past medical history of hypertension, chronic fatigue, who presents to the Emergency Department complaining of nausea and generalized abdominal pain for 1 day.  She describes pain of her abdomen mostly lower, but pain radiates into her upper abdomen as well.  She also complains of intermittent cramping and spasms to her lower legs.  She denies any flank pain burning with urination or increased frequency.  She woke on the morning of arrival and noticed very dark-colored urine.  Denies taking any Azo.  No chest pain or shortness of breath no fever or chills.  Nothing makes her symptoms better or worse.    Home Medications Prior to Admission medications   Medication Sig Start Date End Date Taking? Authorizing Provider  albuterol (VENTOLIN HFA) 108 (90 Base) MCG/ACT inhaler Inhale 1-2 puffs into the lungs every 6 (six) hours as needed for shortness of breath or wheezing.   Yes [provider]  Black Cohosh 40 MG CAPS Take 40 mg by mouth 2 (two) times daily.   Yes [provider]  ergocalciferol  (VITAMIN D2) 1.25 MG (50000 UT) capsule Take 1 capsule (50,000 Units total) by mouth once a week. Patient taking differently: Take 50,000 Units by mouth 2 (two) times a week. 02/28/19  Yes Merlynn Eland F, FNP   losartan -hydrochlorothiazide (HYZAAR) 100-25 MG tablet Take 1 tablet by mouth daily. 10/17/19  Yes Merlynn Eland F, FNP  methocarbamol (ROBAXIN) 500 MG tablet Take 250-500 mg by mouth at bedtime. 01/04/23  Yes [provider]  omeprazole  (PRILOSEC) 20 MG capsule Take 1 capsule (20 mg total) by mouth daily. Patient taking differently: Take 20 mg by mouth daily as needed (Indigestion). 03/04/23  Yes Horton, Charmaine FALCON, MD  ondansetron  (ZOFRAN ) 4 MG tablet Take 1 tablet (4 mg total) by mouth every 6 (six) hours. As needed for nausea vomiting 06/04/23  Yes Tarik Teixeira, PA-C  phentermine  (ADIPEX-P ) 37.5 MG tablet Take 1 tablet (37.5 mg total) by mouth daily before breakfast. 10/17/19  Yes Merlynn Eland FALCON, FNP  Pumpkin Seed-Soy Germ (AZO BLADDER CONTROL/GO-LESS PO) Take 1 tablet by mouth daily.    Yes [provider]  sodium chloride  (AYR) 0.65 % nasal spray Place 1 spray into the nose daily as needed for congestion.   Yes [provider]  Sodium Chloride , Hypertonic, (MURO 128 OP) Place 1 drop into both eyes daily.   Yes [provider]  sulfamethoxazole -trimethoprim  (BACTRIM  DS) 800-160 MG tablet Take 1 tablet by mouth 2 (two) times daily for 7 days. 06/04/23 06/11/23 Yes Harly Pipkins, PA-C  traMADol (ULTRAM) 50 MG tablet Take 50 mg by mouth every 6 (six) hours as needed for moderate pain (pain score 4-6). 03/11/23  Yes [provider]      Allergies  Asa [aspirin], Cefadroxil, Contrast media [iodinated contrast media], and Imitrex [sumatriptan]    Review of Systems   Review of Systems  Constitutional:  Negative for chills and fever.  Respiratory:  Negative for cough and shortness of breath.   Cardiovascular:  Negative for chest pain.  Gastrointestinal:  Positive for abdominal pain and nausea. Negative for diarrhea and vomiting.  Genitourinary:  Positive for hematuria. Negative for difficulty urinating, flank pain, frequency and pelvic pain.   Musculoskeletal:  Positive for myalgias (Cramping pain intermittently to both legs). Negative for back pain.  Neurological:  Negative for weakness.    Physical Exam Updated Vital Signs BP 118/78 (BP Location: Left Arm)   Pulse 76   Temp 97.9 F (36.6 C) (Oral)   Resp 18   SpO2 100%  Physical Exam Vitals and nursing note reviewed.  Constitutional:      General: She is not in acute distress.    Appearance: Normal appearance. She is not ill-appearing or toxic-appearing.  HENT:     Mouth/Throat:     Mouth: Mucous membranes are moist.  Cardiovascular:     Rate and Rhythm: Normal rate and regular rhythm.     Pulses: Normal pulses.  Pulmonary:     Effort: Pulmonary effort is normal. No respiratory distress.     Breath sounds: Normal breath sounds.  Chest:     Chest wall: No tenderness.  Abdominal:     General: There is no distension.     Palpations: Abdomen is soft. There is no mass.     Tenderness: There is abdominal tenderness. There is no right CVA tenderness, left CVA tenderness or guarding.  Musculoskeletal:        General: Normal range of motion.     Right lower leg: No edema.     Left lower leg: No edema.  Skin:    General: Skin is warm.     Capillary Refill: Capillary refill takes less than 2 seconds.  Neurological:     General: No focal deficit present.     Mental Status: She is alert.     Sensory: No sensory deficit.     Motor: No weakness.     ED Results / Procedures / Treatments   Labs (all labs ordered are listed, but only abnormal results are displayed) Labs Reviewed  URINE CULTURE - Abnormal; Notable for the following components:      Result Value   Culture   (*)    Value: >=100,000 COLONIES/mL ESCHERICHIA COLI CULTURE REINCUBATED FOR BETTER GROWTH SUSCEPTIBILITIES TO FOLLOW Performed at [facility], [address], [place], [place] [address]    All other components within normal limits  COMPREHENSIVE METABOLIC PANEL - Abnormal; Notable for  the following components:   Creatinine, Ser 1.01 (*)    Albumin 3.3 (*)    All other components within normal limits  CBC WITH DIFFERENTIAL/PLATELET - Abnormal; Notable for the following components:   RDW 15.9 (*)    All other components within normal limits  URINALYSIS, ROUTINE W REFLEX MICROSCOPIC - Abnormal; Notable for the following components:   APPearance CLOUDY (*)    Hgb urine dipstick LARGE (*)    Protein, ur 100 (*)    Nitrite POSITIVE (*)    Bacteria, UA RARE (*)    All other components within normal limits  LIPASE, BLOOD    EKG None  Radiology No results found.  Procedures Procedures    Medications Ordered in ED Medications  sodium chloride  0.9 %  bolus 1,000 mL (0 mLs Intravenous Stopped 06/04/23 1651)  ondansetron  (ZOFRAN ) injection 4 mg (4 mg Intravenous Given 06/04/23 1223)  morphine  (PF) 4 MG/ML injection 3 mg (3 mg Intravenous Given 06/04/23 1440)  sulfamethoxazole -trimethoprim  (BACTRIM  DS) 800-160 MG per tablet 1 tablet (1 tablet Oral Given 06/04/23 1443)    ED Course/ Medical Decision Making/ A&P                                 Medical Decision Making Patient here with diffuse abdominal pain but describes mostly lower abdominal pain.  No back pain or CVA tenderness.  Pains been associated with some nausea.  Woke prior to ER arrival and noticed very dark urine.  No history of kidney stones.  No fever or vomiting.   On exam, patient nontoxic-appearing.  Vital signs are reassuring.  No fever here.  I do not appreciate any CVA tenderness on exam but she does have some diffuse mostly lower abdominal tenderness.  Differential would include but not limited to acute cystitis, acute surgical abdomen, acute diverticulitis, pyelonephritis and kidney stone also considered but patient is without fever, vomiting and flank pain.  Amount and/or Complexity of Data Reviewed Labs: ordered.    Details: Labs interpreted by me, no evidence of leukocytosis, chemistries without  significant derangement.  Her lipase is unremarkable.  Urinalysis shows cloudy urine with large amount of blood positive nitrites 21-50 white cells and rare bacteria.  Urine cultures pending Radiology: ordered.    Details: CT abdomen and pelvis ordered for further evaluation of patient's symptoms.  Shows no evidence of acute intra-abdominal or intrapelvic process Discussion of management or test interpretation with external provider(s):   Patient here with likely acute cystitis with hematuria.  No exam findings or imaging to suggest pyelonephritis.  No evidence of kidney stone on imaging.  Patient is nontoxic-appearing.  Her urine culture is pending pain has been improved after IV fluids antiemetic and pain medication.  On further review of medical records, patient does have documented allergy to cephalosporins.  She had urine culture 1 year ago that grew E. coli and Klebsiella with resistance to ampicillin and intermediate resistance to nitrofurantoin .  Culture was sensitive at that time to Bactrim .  Based on previous culture results, will proceed with Bactrim  today.  Repeat culture today is pending.  Patient verbalized understanding that she will be notified if additional antibiotic or change in antibiotic is needed.  She appears appropriate for discharge home, agreeable to close outpatient follow-up for recheck of her urine in 1 week.  ER return precautions were also discussed.  Risk Prescription drug management.           Final Clinical Impression(s) / ED Diagnoses Final diagnoses:  Acute cystitis with hematuria    Rx / DC Orders ED Discharge Orders          Ordered    sulfamethoxazole -trimethoprim  (BACTRIM  DS) 800-160 MG tablet  2 times daily        06/04/23 1528    ondansetron  (ZOFRAN ) 4 MG tablet  Every 6 hours        06/04/23 1528              Herlinda Milling, PA-C 06/07/23 1203    Suzette Pac, MD 06/09/23 1404

## 2023-06-08 LAB — URINE CULTURE: Culture: >=100000 — AB

## 2023-06-09 ENCOUNTER — Telehealth (HOSPITAL_BASED_OUTPATIENT_CLINIC_OR_DEPARTMENT_OTHER): Payer: Self-pay

## 2023-06-09 NOTE — Telephone Encounter (Signed)
Post ED Visit - Positive Culture Follow-up  Culture report reviewed by antimicrobial stewardship pharmacist: [facility] []  Enzo Bi, Pharm.D. []  Celedonio Miyamoto, Pharm.D., BCPS AQ-ID []  Garvin Fila, Pharm.D., BCPS []  Georgina Pillion, Pharm.D., BCPS []  [place], [address].D., BCPS, AAHIVP []  Estella Husk, Pharm.D., BCPS, AAHIVP []  Lysle Pearl, PharmD, BCPS []  Phillips Climes, PharmD, BCPS []  Agapito Games, PharmD, BCPS [x]  Ivery Quale, PharmD []  Mervyn Gay, PharmD, BCPS []  Vinnie Level, PharmD  [facility] []  Len Childs, PharmD []  Greer Pickerel, PharmD []  Adalberto Cole, PharmD []  Perlie Gold, Rph []  Lonell Face) Jean Rosenthal, PharmD []  Earl Many, PharmD []  Junita Push, PharmD []  Dorna Leitz, PharmD []  Terrilee Files, PharmD []  Lynann Beaver, PharmD []  Keturah Barre, PharmD []  Loralee Pacas, PharmD []  Bernadene Person, PharmD   Positive urine culture Treated with Sulfamethoxazole-Trimethoprim, organism sensitive to the same and no further patient follow-up is required at this time.  Sandria Senter 06/09/2023, 9:10 AM

## 2023-10-29 ENCOUNTER — Emergency Department (HOSPITAL_COMMUNITY)
Admission: EM | Admit: 2023-10-29 | Discharge: 2023-10-29 | Disposition: A | Discharge status: Home / Self Care | Attending: Emergency Medicine | Admitting: Emergency Medicine

## 2023-10-29 ENCOUNTER — Other Ambulatory Visit: Payer: Self-pay

## 2023-10-29 ENCOUNTER — Encounter (HOSPITAL_COMMUNITY): Payer: Self-pay | Admitting: Emergency Medicine

## 2023-10-29 DIAGNOSIS — N3001 Acute cystitis with hematuria: Secondary | ICD-10-CM | POA: Insufficient documentation

## 2023-10-29 DIAGNOSIS — R319 Hematuria, unspecified: Secondary | ICD-10-CM | POA: Diagnosis present

## 2023-10-29 LAB — URINALYSIS, W/ REFLEX TO CULTURE (INFECTION SUSPECTED)
Bilirubin Urine: NEGATIVE
Glucose, UA: NEGATIVE mg/dL
Ketones, ur: NEGATIVE mg/dL
Leukocytes,Ua: NEGATIVE
Nitrite: POSITIVE — AB
Protein, ur: 30 mg/dL — AB
RBC / HPF: >50 RBC/hpf (ref 0–5)
Specific Gravity, Urine: 1.021 (ref 1.005–1.030)
pH: 5 (ref 5.0–8.0)

## 2023-10-29 MED ORDER — NITROFURANTOIN MONOHYD MACRO 100 MG PO CAPS: 100.0000 mg | ORAL_CAPSULE | Freq: Two times a day (BID) | ORAL | 0 refills | Status: AC

## 2023-10-29 MED ORDER — NITROFURANTOIN MONOHYD MACRO 100 MG PO CAPS
100.0000 mg | ORAL_CAPSULE | Freq: Once | ORAL | Status: AC
  Administered 2023-10-29: 100 mg via ORAL
  Filled 2023-10-29: qty 1

## 2023-10-29 NOTE — ED Provider Notes (Signed)
  EMERGENCY DEPARTMENT AT [facility]  Provider Note  CSN: [id number] Arrival date & time: 10/29/23 0557  History Chief Complaint  Patient presents with   Hematuria    Rhonda Merritt is a 63 y.o. female with history of UTI reports she has noticed some blood in her urine since last night. Denies pain, has urgency at baseline. No fever or flank pain. She has felt nauseated off and on for several weeks. Has not spoken to her PCP about those symptoms    Home Medications Prior to Admission medications   Medication Sig Start Date End Date Taking? Authorizing Provider  nitrofurantoin , macrocrystal-monohydrate, (MACROBID ) 100 MG capsule Take 1 capsule (100 mg total) by mouth 2 (two) times daily. 10/29/23  Yes Roselyn Carlin NOVAK, MD  albuterol (VENTOLIN HFA) 108 (90 Base) MCG/ACT inhaler Inhale 1-2 puffs into the lungs every 6 (six) hours as needed for shortness of breath or wheezing.    [provider]  Black Cohosh 40 MG CAPS Take 40 mg by mouth 2 (two) times daily.    [provider]  ergocalciferol  (VITAMIN D2) 1.25 MG (50000 UT) capsule Take 1 capsule (50,000 Units total) by mouth once a week. Patient taking differently: Take 50,000 Units by mouth 2 (two) times a week. 02/28/19   Merlynn Niki FALCON, FNP  losartan -hydrochlorothiazide (HYZAAR) 100-25 MG tablet Take 1 tablet by mouth daily. 10/17/19   Merlynn Niki FALCON, FNP  methocarbamol (ROBAXIN) 500 MG tablet Take 250-500 mg by mouth at bedtime. 01/04/23   [provider]  omeprazole  (PRILOSEC) 20 MG capsule Take 1 capsule (20 mg total) by mouth daily. Patient taking differently: Take 20 mg by mouth daily as needed (Indigestion). 03/04/23   Horton, Charmaine FALCON, MD  ondansetron  (ZOFRAN ) 4 MG tablet Take 1 tablet (4 mg total) by mouth every 6 (six) hours. As needed for nausea vomiting 06/04/23   Triplett, Tammy, PA-C  phentermine  (ADIPEX-P ) 37.5 MG tablet Take 1 tablet (37.5 mg total) by mouth daily  before breakfast. 10/17/19   Merlynn Niki FALCON, FNP  Pumpkin Seed-Soy Germ (AZO BLADDER CONTROL/GO-LESS PO) Take 1 tablet by mouth daily.     [provider]  sodium chloride  (AYR) 0.65 % nasal spray Place 1 spray into the nose daily as needed for congestion.    [provider]  Sodium Chloride , Hypertonic, (MURO 128 OP) Place 1 drop into both eyes daily.    [provider]  traMADol (ULTRAM) 50 MG tablet Take 50 mg by mouth every 6 (six) hours as needed for moderate pain (pain score 4-6). 03/11/23   [provider]     Allergies    Asa [aspirin], Cefadroxil, Contrast media [iodinated contrast media], and Imitrex [sumatriptan]   Review of Systems   Review of Systems Please see HPI for pertinent positives and negatives  Physical Exam BP 124/85 (BP Location: Left Arm) Comment: Simultaneous filing. User may not have seen previous data.  Pulse 75   Temp 98.5 F (36.9 C) (Oral)   Resp 19 Comment: Simultaneous filing. User may not have seen previous data.  Ht 5' 5 (1.651 m)   Wt 95.3 kg   SpO2 97%   BMI 34.95 kg/m   Physical Exam Vitals and nursing note reviewed.  HENT:     Head: Normocephalic.     Nose: Nose normal.  Eyes:     Extraocular Movements: Extraocular movements intact.  Pulmonary:     Effort: Pulmonary effort is normal.  Musculoskeletal:  General: Normal range of motion.     Cervical back: Neck supple.  Skin:    Findings: No rash (on exposed skin).  Neurological:     Mental Status: She is alert and oriented to person, place, and time.  Psychiatric:        Mood and Affect: Mood normal.     ED Results / Procedures / Treatments   EKG None  Procedures Procedures  Medications Ordered in the ED Medications  nitrofurantoin  (macrocrystal-monohydrate) (MACROBID ) capsule 100 mg (has no administration in time range)    Initial Impression and Plan  Patient here with hematuria, history of cystitis. Vitals are reassuring.  Will check UA, if clearly infected, will treat for recurrence and recommend PCP and Urology follow up. If no signs of infection, may require a more thorough lab evaluation.   ED Course   Clinical Course as of 10/29/23 0653  [place] Oct 29, 2023  0651 UA is consistent with UTI. Prior culture was pansensitive e coli. Will give Rx for Macrobid , recommend PCP and Urology follow up, RTED for any other concerns.   [CS]    Clinical Course User Index [CS] Roselyn Carlin NOVAK, MD     MDM Rules/Calculators/A&P Medical Decision Making Problems Addressed: Acute cystitis with hematuria: acute illness or injury  Amount and/or Complexity of Data Reviewed Labs: ordered. Decision-making details documented in ED Course.  Risk Prescription drug management.     Final Clinical Impression(s) / ED Diagnoses Final diagnoses:  Acute cystitis with hematuria    Rx / DC Orders ED Discharge Orders          Ordered    nitrofurantoin , macrocrystal-monohydrate, (MACROBID ) 100 MG capsule  2 times daily        10/29/23 9347             Roselyn Carlin NOVAK, MD 10/29/23 [phone]

## 2023-10-29 NOTE — ED Triage Notes (Addendum)
 Pt here with c/o hematuria since last night. States she initially had backache and now c/o bloating feeling in abdomen. Pt also c/o nausea and a headache.

## 2023-10-29 NOTE — ED Notes (Signed)
 Pt does not need to urinate at this time. States she will notify staff when she needs to go.

## 2023-10-31 LAB — URINE CULTURE: Culture: >=100000 — AB

## 2023-11-01 ENCOUNTER — Telehealth (HOSPITAL_BASED_OUTPATIENT_CLINIC_OR_DEPARTMENT_OTHER): Payer: Self-pay | Admitting: *Deleted

## 2023-11-01 NOTE — Telephone Encounter (Signed)
 Post ED Visit - Positive Culture Follow-up  Culture report reviewed by antimicrobial stewardship pharmacist: [facility] [x] [name], [place].D. []  Venetia Gully, Pharm.D., BCPS AQ-ID []  Garrel Crews, Pharm.D., BCPS []  Almarie Lunger, Pharm.D., BCPS []  [place], [address].D., BCPS, AAHIVP []  Rosaline Bihari, Pharm.D., BCPS, AAHIVP []  Vernell Meier, PharmD, BCPS []  Latanya Hint, PharmD, BCPS []  Donald Medley, PharmD, BCPS []  Rocky Bold, PharmD []  Dorothyann Alert, PharmD, BCPS []  Morene Babe, PharmD  [facility] []  Rosaline Edison, PharmD []  Romona Bliss, PharmD []  Dolphus Roller, PharmD []  Veva Seip, Rph []  Vernell Daunt) Leonce, PharmD []  Eva Allis, PharmD []  Rosaline Millet, PharmD []  Iantha Batch, PharmD []  Arvin Gauss, PharmD []  Wanda Hasting, PharmD []  Ronal Rav, PharmD []  Rocky Slade, PharmD []  Bard Jeans, PharmD   Positive urine culture Treated with Nitrofurantonin , organism sensitive to the same and no further patient follow-up is required at this time.  Rhonda Merritt 11/01/2023, 1:33 PM
